# Patient Record
Sex: Female | Born: 1975 | Race: White | Hispanic: No | Marital: Married | Smoking: Never smoker
Health system: Southern US, Community
[De-identification: ages and names within clinical notes are randomized; demographics above are authoritative.]

## PROBLEM LIST (undated history)

## (undated) DIAGNOSIS — F419 Anxiety disorder, unspecified: Secondary | ICD-10-CM

## (undated) HISTORY — DX: Anxiety disorder, unspecified: F41.9

---

## 1999-01-27 ENCOUNTER — Other Ambulatory Visit: Admission: RE | Admit: 1999-01-27 | Discharge: 1999-01-27 | Payer: Self-pay | Admitting: Obstetrics and Gynecology

## 1999-06-23 HISTORY — PX: AUGMENTATION MAMMAPLASTY: SUR837

## 2000-01-26 ENCOUNTER — Other Ambulatory Visit: Admission: RE | Admit: 2000-01-26 | Discharge: 2000-01-26 | Payer: Self-pay | Admitting: Obstetrics and Gynecology

## 2000-08-27 ENCOUNTER — Encounter: Payer: Self-pay | Admitting: Surgery

## 2000-08-27 ENCOUNTER — Encounter: Admission: RE | Admit: 2000-08-27 | Discharge: 2000-08-27 | Payer: Self-pay | Admitting: Surgery

## 2001-02-01 ENCOUNTER — Other Ambulatory Visit: Admission: RE | Admit: 2001-02-01 | Discharge: 2001-02-01 | Payer: Self-pay | Admitting: Obstetrics and Gynecology

## 2002-02-02 ENCOUNTER — Other Ambulatory Visit: Admission: RE | Admit: 2002-02-02 | Discharge: 2002-02-02 | Payer: Self-pay | Admitting: Obstetrics and Gynecology

## 2004-03-19 ENCOUNTER — Other Ambulatory Visit: Admission: RE | Admit: 2004-03-19 | Discharge: 2004-03-19 | Payer: Self-pay | Admitting: Obstetrics and Gynecology

## 2005-03-23 ENCOUNTER — Other Ambulatory Visit: Admission: RE | Admit: 2005-03-23 | Discharge: 2005-03-23 | Payer: Self-pay | Admitting: Obstetrics and Gynecology

## 2006-03-30 ENCOUNTER — Other Ambulatory Visit: Admission: RE | Admit: 2006-03-30 | Discharge: 2006-03-30 | Payer: Self-pay | Admitting: Obstetrics and Gynecology

## 2007-04-05 ENCOUNTER — Other Ambulatory Visit: Admission: RE | Admit: 2007-04-05 | Discharge: 2007-04-05 | Payer: Self-pay | Admitting: Obstetrics and Gynecology

## 2007-06-20 ENCOUNTER — Other Ambulatory Visit: Admission: RE | Admit: 2007-06-20 | Discharge: 2007-06-20 | Payer: Self-pay | Admitting: Obstetrics and Gynecology

## 2007-12-02 ENCOUNTER — Ambulatory Visit (HOSPITAL_COMMUNITY): Admission: RE | Admit: 2007-12-02 | Discharge: 2007-12-02 | Payer: Self-pay | Admitting: Obstetrics and Gynecology

## 2008-04-17 ENCOUNTER — Inpatient Hospital Stay (HOSPITAL_COMMUNITY): Admission: AD | Admit: 2008-04-17 | Discharge: 2008-04-19 | Payer: Self-pay | Admitting: Obstetrics and Gynecology

## 2008-10-23 ENCOUNTER — Other Ambulatory Visit: Admission: RE | Admit: 2008-10-23 | Discharge: 2008-10-23 | Payer: Self-pay | Admitting: Obstetrics and Gynecology

## 2010-03-11 ENCOUNTER — Inpatient Hospital Stay (HOSPITAL_COMMUNITY): Admission: AD | Admit: 2010-03-11 | Discharge: 2010-03-13 | Payer: Self-pay | Admitting: Obstetrics and Gynecology

## 2010-07-13 ENCOUNTER — Encounter: Payer: Self-pay | Admitting: Obstetrics and Gynecology

## 2010-09-04 LAB — CBC
HCT: 36.8 % (ref 36.0–46.0)
HCT: 36.8 % (ref 36.0–46.0)
Hemoglobin: 12.8 g/dL (ref 12.0–15.0)
MCH: 32.5 pg (ref 26.0–34.0)
MCHC: 33.8 g/dL (ref 30.0–36.0)
MCHC: 34.7 g/dL (ref 30.0–36.0)
MCV: 93.4 fL (ref 78.0–100.0)
MCV: 95.2 fL (ref 78.0–100.0)
Platelets: 224 10*3/uL (ref 150–400)
Platelets: 234 10*3/uL (ref 150–400)
RBC: 3.94 MIL/uL (ref 3.87–5.11)
RDW: 13.4 % (ref 11.5–15.5)
RDW: 13.9 % (ref 11.5–15.5)
WBC: 10.5 10*3/uL (ref 4.0–10.5)
WBC: 16.9 10*3/uL — ABNORMAL HIGH (ref 4.0–10.5)

## 2010-11-04 NOTE — Op Note (Signed)
Carolyn Harris, MCCLISH NO.:  [id number]   MEDICAL RECORD NO.:  [id number]          PATIENT TYPE:  INP   LOCATION:  9130                          FACILITY:  [facility]   PHYSICIAN:  Charles A. Delcambre, MDDATE OF BIRTH:  06-17-1976   DATE OF PROCEDURE:  DATE OF DISCHARGE:                               OPERATIVE REPORT   This patient did push for approximately 30 minutes and had a spontaneous  vaginal delivery, intact perineum.  Nuchal cord x1, vigorous female,  Apgars see delivery record 8 and 9 as I recall, 2730 g as I recall,  female.  Delivery was uncomplicated up to that point at approximately 30  minutes.  Placenta had not delivered.  Edge could be felt, but with  pushing and gentle traction the placenta would not move.  She gave  informed consent for manual extraction.  Epidural was adequate.  Hand  was inserted carefully and passed to the fundus.  The top edge of the  placenta was adherent.  This area was removed with dissection under the  placental edge, and the placenta was moved, the majority intact but not  intact because several fragments were removed as well from the top edge.  Careful exploration with the hand yielded smooth intrauterine surface  with no palpable placenta or placenta fragments after extraction.  The  patient tolerated this well.  Estimated blood loss 400 mL.  Intact  perineum.  No repair necessary.  Placenta to pathology.  Mother and baby  are recovering stably at this time.      Charles A. Sydnee Cabal, MD  Electronically Signed     CAD/MEDQ  D:  04/17/2008  T:  04/18/2008  Job:  [id number]

## 2010-12-15 ENCOUNTER — Other Ambulatory Visit (HOSPITAL_COMMUNITY)
Admission: RE | Admit: 2010-12-15 | Discharge: 2010-12-15 | Disposition: A | Discharge status: Home / Self Care | Payer: PRIVATE HEALTH INSURANCE | Source: Ambulatory Visit | Attending: Obstetrics and Gynecology | Admitting: Obstetrics and Gynecology

## 2010-12-15 ENCOUNTER — Other Ambulatory Visit: Payer: Self-pay | Admitting: Obstetrics and Gynecology

## 2010-12-15 DIAGNOSIS — Z01419 Encounter for gynecological examination (general) (routine) without abnormal findings: Secondary | ICD-10-CM | POA: Insufficient documentation

## 2011-01-07 ENCOUNTER — Other Ambulatory Visit: Payer: Self-pay | Admitting: Obstetrics and Gynecology

## 2011-03-23 LAB — CBC
Hemoglobin: 12.9
MCHC: 33.9
MCV: 94.8
Platelets: 221
Platelets: 227
RBC: 3.73 — ABNORMAL LOW
RDW: 13
WBC: 16.9 — ABNORMAL HIGH

## 2011-03-23 LAB — RPR: RPR Ser Ql: NONREACTIVE

## 2013-03-23 ENCOUNTER — Ambulatory Visit: Payer: Self-pay | Admitting: Obstetrics & Gynecology

## 2013-03-23 LAB — CBC
MCH: 30.4 pg (ref 26.0–34.0)
WBC: 5.9 10*3/uL (ref 3.6–11.0)

## 2013-04-06 ENCOUNTER — Ambulatory Visit: Payer: Self-pay | Admitting: Obstetrics & Gynecology

## 2013-04-06 HISTORY — PX: ABDOMINAL HYSTERECTOMY: SHX81

## 2013-04-06 HISTORY — PX: BLADDER SUSPENSION: SHX72

## 2013-04-07 LAB — HEMOGLOBIN: HGB: 12.2 g/dL (ref 12.0–16.0)

## 2013-04-07 LAB — PATHOLOGY REPORT

## 2013-04-27 ENCOUNTER — Ambulatory Visit (INDEPENDENT_AMBULATORY_CARE_PROVIDER_SITE_OTHER): Payer: BC Managed Care – PPO | Admitting: General Surgery

## 2013-04-27 ENCOUNTER — Encounter: Payer: Self-pay | Admitting: General Surgery

## 2013-04-27 VITALS — BP 128/64 | HR 74 | Resp 14 | Ht 66.5 in | Wt 167.0 lb

## 2013-04-27 DIAGNOSIS — M79609 Pain in unspecified limb: Secondary | ICD-10-CM

## 2013-04-27 DIAGNOSIS — M79669 Pain in unspecified lower leg: Secondary | ICD-10-CM

## 2013-04-27 NOTE — Progress Notes (Signed)
Patient ID: Carolyn Harris, female   DOB: 22-Sep-1975, 37 y.o.   MRN: [id number]  Chief Complaint  Patient presents with  . Pain    left calf pain 3-4 days    HPI Carolyn Harris is a 37 y.o. female.  Here today for evaluation of bilateral calf pain.  She states the left calf pain is worse, for 3-4 days. The pain is constant, worse with ambulation. Denies any redness or swelling.  She is post hysterectomy and bladder suspension done on 04-06-13 by Dr Tiburcio Pea. HPI  Past Medical History  Diagnosis Date  . Anxiety     Past Surgical History  Procedure Laterality Date  . Abdominal hysterectomy  04-06-2013  . Bladder suspension  04-06-13    Family History  Problem Relation Age of Onset  . Pulmonary embolism Mother 60    Social History History  Substance Use Topics  . Smoking status: Never Smoker   . Smokeless tobacco: Not on file  . Alcohol Use: No    No Known Allergies  Current Outpatient Prescriptions  Medication Sig Dispense Refill  . escitalopram (LEXAPRO) 10 MG tablet Take 10 mg by mouth daily.       No current facility-administered medications for this visit.    Review of Systems Review of Systems  Blood pressure 128/64, pulse 74, resp. rate 14, height 5' 6.5" (1.689 m), weight 167 lb (75.751 kg).  Physical Exam Physical Exam  Constitutional: She is oriented to person, place, and time. She appears well-developed and well-nourished.  Cardiovascular: Normal rate, regular rhythm and normal heart sounds.   Pulses:      Dorsalis pedis pulses are 2+ on the right side, and 2+ on the left side.       Posterior tibial pulses are 2+ on the right side, and 2+ on the left side.  No lower leg edema, no skin changes, no calf tenderness and no Homans sign  Pulmonary/Chest: Effort normal and breath sounds normal.  Neurological: She is alert and oriented to person, place, and time.  Skin: Skin is warm and dry.    Data Reviewed Duplex scan done today.  Left lower extremity duplex  study of veins was performed. Patient is 3 weeks post hysterectomy complaining of left calf pain starting 3-4 days ago.  The left common femoral and femoral veins are well identified as also the saphenous vein. No clots identified in these veins. Doppler flow appeared to be normal phasic and with good augmentation from  distal compression. Popliteal vein likewise was identified and appears to be normal with normal compression and Doppler flow. The saphenous vein in groin and upper thigh also appears normal.  : No evidence of deep vein thrombosis or superficial phlebitis. Assessment    No evidence of DVT     Plan    Symptomatic care-discussed with pt.        SANKAR,SEEPLAPUTHUR G 04/27/2013, 2:49 PM

## 2013-04-27 NOTE — Patient Instructions (Addendum)
The patient is aware to call back for any questions or concerns or swelling or pain in legs worsen.   Mild compression hose.

## 2013-05-03 ENCOUNTER — Encounter: Payer: Self-pay | Admitting: General Surgery

## 2013-11-29 ENCOUNTER — Ambulatory Visit: Payer: Self-pay | Admitting: Obstetrics & Gynecology

## 2013-11-29 LAB — CBC
HCT: 41.2 % (ref 35.0–47.0)
HGB: 13.9 g/dL (ref 12.0–16.0)
MCH: 29.7 pg (ref 26.0–34.0)
MCHC: 33.8 g/dL (ref 32.0–36.0)
MCV: 88 fL (ref 80–100)
Platelet: 269 10*3/uL (ref 150–440)
RBC: 4.68 10*6/uL (ref 3.80–5.20)
RDW: 12.7 % (ref 11.5–14.5)
WBC: 7.4 10*3/uL (ref 3.6–11.0)

## 2013-12-07 ENCOUNTER — Ambulatory Visit: Payer: Self-pay | Admitting: Obstetrics & Gynecology

## 2013-12-11 LAB — PATHOLOGY REPORT

## 2014-04-23 ENCOUNTER — Encounter: Payer: Self-pay | Admitting: General Surgery

## 2014-10-12 NOTE — Op Note (Signed)
PATIENT NAME:  Carolyn Harris, Carolyn Harris MR#:  [id number] DATE OF BIRTH:  1975/11/20  DATE OF PROCEDURE:  04/06/2013  PREOPERATIVE DIAGNOSIS:  Pelvic organ prolapse with cystocele.   POSTOPERATIVE DIAGNOSIS:  Pelvic organ prolapse with cystocele.   PROCEDURE:  Total vaginal hysterectomy with anterior colporrhaphy and modified McCall culdoplasty.   SURGEON:  Dierdre Searles. Paul Randi Poullard, MD.   ASSISTANT:  Elliot Gurneyarrie Harris. Klett, MD  ANESTHESIA:  General.   ESTIMATED BLOOD LOSS:  Minimal.   COMPLICATIONS:  None.   FINDINGS:  Grade 3+ prolapse of cervix and uterus along with cystocele. The patient had normal ovaries.   DISPOSITION:  To Recovery Room stable.   TECHNIQUE:  The patient is prepped and draped in the usual sterile fashion after adequate anesthesia is obtained in the dorsal lithotomy position. Retractors are placed and the cervix was grasped with a Christella HartiganJacobs tenaculum. The circumference of the cervix is infiltrated with 1% lidocaine with epinephrine and then a circumferential incision is made using Bovie electrocautery. Using Metzenbaum scissors, the posterior cul-de-sac is dissected and penetrated with a long, weighted speculum placed into the posterior cul-de-sac. The uterosacral ligaments are clamped with Heaney clamps, transected, and suture ligated and then sutured to the vaginal cuff. The uterine arteries are clamped, transected, and suture ligated. The anterior peritoneum was dissected with the anterior cul-de-sac penetrated and a retractor was placed. Dissection and clamping is then performed to the level of the cornua with the uterus then amputated. Excellent hemostasis is noted at all clamping and pedicle sites. The ovaries are visualized to be normal along with fallopian tubes attached.   The peritoneum is closed with a running 0 Vicryl suture in a pursestring fashion. The posterior peritoneum is closed with incorporation of the uterosacral ligaments and posterior peritoneum using an Ethibond suture in a  modified McCall culdoplasty technique. The peritoneal closure is achieved with excellent hemostasis and closure noted.   Allis clamps are placed along the anterior midline vaginal wall for anterior colporrhaphy purposes due to significant cystocele. An incision that is then made with a scalpel and further dissection in the midline using Metzenbaum scissors. The endopelvic fascia is then dissected away from the vaginal mucosa. The fascia is then plicated using interrupted 1-0 Vicryl sutures. Excess vaginal mucosa is excised. The vaginal mucosa is then closed with a running locking 2-0 Vicryl suture to incorporate both the anterior colporrhaphy and hysterectomy sites. Excellent hemostasis is noted throughout. The vaginal cavity is irrigated with aspiration of the fluid. A packing sponge with AVC cream applied to the packing sponge is then placed vaginally for overnight use. A Foley catheter is also placed for overnight use with clear urine noted. The patient tolerated the procedure well and goes to the Recovery Room in stable condition. All sponge, instrument, and needle counts are correct.   ____________________________ R. Annamarie MajorPaul Desirae Mancusi, MD rph:jm D: 04/06/2013 10:45:53 ET T: 04/06/2013 10:59:27 ET JOB#: [id number]  cc: Dierdre Searles. Paul Makayleigh Poliquin, MD, <Dictator> Nadara MustardOBERT P Sarely Stracener MD ELECTRONICALLY SIGNED 04/07/2013 7:34

## 2014-10-13 NOTE — Op Note (Signed)
PATIENT NAME:  Carolyn Harris, Carolyn Harris MR#:  [id number] DATE OF BIRTH:  10-Jun-1976  DATE OF PROCEDURE:  12/07/2013  PREOPERATIVE DIAGNOSES: Pelvic pain, dyspareunia.   POSTOPERATIVE DIAGNOSES:  Pelvic pain, dyspareunia.   PROCEDURE: Diagnostic laparoscopy, operative laparoscopy with bilateral salpingectomy, cystoscopy.   SURGEON: Annamarie MajorPaul Harris, Carolyn Harris   ANESTHESIA: General.   ESTIMATED BLOOD LOSS: Minimal.   COMPLICATIONS: None.   FINDINGS: Fallopian tubes with some cysts noted as well as adherent to the pelvic sidewall and area of the vaginal cuff and repair. Ovaries are normal, although there is a ruptured right ovarian cyst visualized, very small, and healing in nature.   DISPOSITION: To recovery room in stable condition.   TECHNIQUE: The patient is prepped in the sterile fashion. After adequate anesthesia is obtained in the dorsal lithotomy position, bladder is drained with a Robinson catheter.   Attention is then turned to the abdomen where a Veress needle inserted with a 5 mm infraumbilical incision after Marcaine is used to anesthetize the skin. Veress needle placement is confirmed using the hanging drop technique and the abdomen is then insufflated with CO2 gas. A 5 mm trocar is then inserted under visualization with the laparoscope with no injuries or bleeding noted. The patient is placed in Trendelenburg  positioning. The above-mentioned findings are visualized.   The 5 mm trocar is then placed in the right and left lower quadrants lateral to the inferior and epigastric blood vessels with no injuries or bleeding noted.  The Harmonic scalpel is used to dissect and excise the right and left fallopian tubes away from the ovary and mesosalpinx with preservation of the ovaries and the main blood supply. The right fallopian tube is also adherent down to the level of the vaginal cuff area and is completely excised down in this area with no bleeding or injuries noted. Ureters are visualized to be out of  harm's way and there is no bowel involvement or complication. The right ovarian cyst is visualized and is cauterized, but there is really no bleeding and no extensive nature to the cyst at this time.  It has already ruptured.  The fluid in the cul-de-sac is aspirated. The patient is leveled . Gas is expelled. Trocars are removed and the skin is closed with Dermabond.   Cystoscopy is performed with saline distention of the bladder up to 400 mL. There are no glomerulations, ulcerations, or other abnormalities noted within the bladder. Bilateral ureters are visualized to be patent and normal. Fluid is removed and then the cystoscope is also removed. The patient goes to the recovery room in stable condition. All sponge, instrument, and needle counts were correct.    ____________________________ R. Annamarie MajorPaul Harris, Carolyn Harris rph:dd D: 12/07/2013 17:26:08 ET T: 12/08/2013 02:37:59 ET JOB#: [id number]  cc: Dierdre Searles. Carolyn Harris, Carolyn Harris, <Dictator> Carolyn Harris ELECTRONICALLY SIGNED 12/18/2013 8:58

## 2015-11-05 ENCOUNTER — Other Ambulatory Visit: Payer: Self-pay | Admitting: Obstetrics & Gynecology

## 2015-11-05 DIAGNOSIS — Z1231 Encounter for screening mammogram for malignant neoplasm of breast: Secondary | ICD-10-CM

## 2016-02-10 ENCOUNTER — Ambulatory Visit: Payer: Self-pay | Admitting: Nurse Practitioner

## 2016-02-11 ENCOUNTER — Ambulatory Visit
Admission: RE | Admit: 2016-02-11 | Discharge: 2016-02-11 | Disposition: A | Discharge status: Home / Self Care | Payer: 59 | Source: Ambulatory Visit | Attending: Obstetrics & Gynecology | Admitting: Obstetrics & Gynecology

## 2016-02-11 ENCOUNTER — Other Ambulatory Visit: Payer: Self-pay | Admitting: Obstetrics & Gynecology

## 2016-02-11 DIAGNOSIS — Z1231 Encounter for screening mammogram for malignant neoplasm of breast: Secondary | ICD-10-CM

## 2016-02-12 ENCOUNTER — Ambulatory Visit: Payer: Self-pay | Admitting: Family Medicine

## 2016-02-12 ENCOUNTER — Ambulatory Visit: Payer: Self-pay | Admitting: Nurse Practitioner

## 2016-03-16 ENCOUNTER — Ambulatory Visit: Payer: Self-pay | Admitting: Family Medicine

## 2016-03-30 ENCOUNTER — Ambulatory Visit: Payer: Self-pay | Admitting: Family Medicine

## 2016-04-06 ENCOUNTER — Ambulatory Visit (INDEPENDENT_AMBULATORY_CARE_PROVIDER_SITE_OTHER): Payer: 59 | Admitting: Family Medicine

## 2016-04-06 ENCOUNTER — Encounter (INDEPENDENT_AMBULATORY_CARE_PROVIDER_SITE_OTHER): Payer: Self-pay

## 2016-04-06 ENCOUNTER — Encounter: Payer: Self-pay | Admitting: Family Medicine

## 2016-04-06 VITALS — BP 120/82 | HR 78 | Temp 99.0°F | Ht 68.0 in | Wt 161.2 lb

## 2016-04-06 DIAGNOSIS — Z0000 Encounter for general adult medical examination without abnormal findings: Secondary | ICD-10-CM

## 2016-04-06 DIAGNOSIS — Z0001 Encounter for general adult medical examination with abnormal findings: Secondary | ICD-10-CM | POA: Insufficient documentation

## 2016-04-06 LAB — COMPREHENSIVE METABOLIC PANEL
ALBUMIN: 4.5 g/dL (ref 3.5–5.2)
ALK PHOS: 59 U/L (ref 39–117)
ALT: 13 U/L (ref 0–35)
AST: 17 U/L (ref 0–37)
BILIRUBIN TOTAL: 0.6 mg/dL (ref 0.2–1.2)
BUN: 9 mg/dL (ref 6–23)
CALCIUM: 9.1 mg/dL (ref 8.4–10.5)
CO2: 27 meq/L (ref 19–32)
Chloride: 104 mEq/L (ref 96–112)
Creatinine, Ser: 0.65 mg/dL (ref 0.40–1.20)
GFR: 106.52 mL/min (ref >60.00)
Glucose, Bld: 114 mg/dL — ABNORMAL HIGH (ref 70–99)
Potassium: 4.1 mEq/L (ref 3.5–5.1)
SODIUM: 139 meq/L (ref 135–145)
TOTAL PROTEIN: 7.2 g/dL (ref 6.0–8.3)

## 2016-04-06 LAB — LIPID PANEL
CHOL/HDL RATIO: 5
Cholesterol: 192 mg/dL (ref 0–200)
HDL: 37.3 mg/dL — AB (ref >39.00)
LDL Cholesterol: 140 mg/dL — ABNORMAL HIGH (ref 0–99)
NonHDL: 155.19
TRIGLYCERIDES: 78 mg/dL (ref 0.0–149.0)
VLDL: 15.6 mg/dL (ref 0.0–40.0)

## 2016-04-06 LAB — CBC
HEMATOCRIT: 39.9 % (ref 36.0–46.0)
Hemoglobin: 13.6 g/dL (ref 12.0–15.0)
MCHC: 34.1 g/dL (ref 30.0–36.0)
MCV: 87.9 fl (ref 78.0–100.0)
Platelets: 273 10*3/uL (ref 150.0–400.0)
RBC: 4.54 Mil/uL (ref 3.87–5.11)
RDW: 12.4 % (ref 11.5–15.5)
WBC: 5.2 10*3/uL (ref 4.0–10.5)

## 2016-04-06 LAB — HEMOGLOBIN A1C: Hgb A1c MFr Bld: 5.3 % (ref 4.6–6.5)

## 2016-04-06 LAB — TSH: TSH: 1.48 u[IU]/mL (ref 0.35–4.50)

## 2016-04-06 MED ORDER — BUPROPION HCL ER (SR) 150 MG PO TB12: 150.0000 mg | ORAL_TABLET | Freq: Two times a day (BID) | ORAL | 1 refills | Status: DC

## 2016-04-06 NOTE — Assessment & Plan Note (Signed)
Flu shot up-to-date. Mammogram up-to-date. No longer needs Pap smear due to being status post hysterectomy. Tetanus up-to-date. Screening labs today.

## 2016-04-06 NOTE — Progress Notes (Signed)
Pre visit review using our clinic review tool, if applicable. No additional management support is needed unless otherwise documented below in the visit note. 

## 2016-04-06 NOTE — Patient Instructions (Signed)
I have increased the Wellbutrin.  Follow up annually.  Take care   Dr. Lacinda Axon   Health Maintenance, Female Adopting a healthy lifestyle and getting preventive care can go a long way to promote health and wellness. Talk with your health care provider about what schedule of regular examinations is right for you. This is a good chance for you to check in with your provider about disease prevention and staying healthy. In between checkups, there are plenty of things you can do on your own. Experts have done a lot of research about which lifestyle changes and preventive measures are most likely to keep you healthy. Ask your health care provider for more information. WEIGHT AND DIET  Eat a healthy diet  Be sure to include plenty of vegetables, fruits, low-fat dairy products, and lean protein.  Do not eat a lot of foods high in solid fats, added sugars, or salt.  Get regular exercise. This is one of the most important things you can do for your health.  Most adults should exercise for at least 150 minutes each week. The exercise should increase your heart rate and make you sweat (moderate-intensity exercise).  Most adults should also do strengthening exercises at least twice a week. This is in addition to the moderate-intensity exercise.  Maintain a healthy weight  Body mass index (BMI) is a measurement that can be used to identify possible weight problems. It estimates body fat based on height and weight. Your health care provider can help determine your BMI and help you achieve or maintain a healthy weight.  For females 67 years of age and older:   A BMI below 18.5 is considered underweight.  A BMI of 18.5 to 24.9 is normal.  A BMI of 25 to 29.9 is considered overweight.  A BMI of 30 and above is considered obese.  Watch levels of cholesterol and blood lipids  You should start having your blood tested for lipids and cholesterol at 41 years of age, then have this test every 5  years.  You may need to have your cholesterol levels checked more often if:  Your lipid or cholesterol levels are high.  You are older than 41 years of age.  You are at high risk for heart disease.  CANCER SCREENING   Lung Cancer  Lung cancer screening is recommended for adults 36-21 years old who are at high risk for lung cancer because of a history of smoking.  A yearly low-dose CT scan of the lungs is recommended for people who:  Currently smoke.  Have quit within the past 15 years.  Have at least a 30-pack-year history of smoking. A pack year is smoking an average of one pack of cigarettes a day for 1 year.  Yearly screening should continue until it has been 15 years since you quit.  Yearly screening should stop if you develop a health problem that would prevent you from having lung cancer treatment.  Breast Cancer  Practice breast self-awareness. This means understanding how your breasts normally appear and feel.  It also means doing regular breast self-exams. Let your health care provider know about any changes, no matter how small.  If you are in your 20s or 30s, you should have a clinical breast exam (CBE) by a health care provider every 1-3 years as part of a regular health exam.  If you are 41 or older, have a CBE every year. Also consider having a breast X-ray (mammogram) every year.  If you have  a family history of breast cancer, talk to your health care provider about genetic screening.  If you are at high risk for breast cancer, talk to your health care provider about having an MRI and a mammogram every year.  Breast cancer gene (BRCA) assessment is recommended for women who have family members with BRCA-related cancers. BRCA-related cancers include:  Breast.  Ovarian.  Tubal.  Peritoneal cancers.  Results of the assessment will determine the need for genetic counseling and BRCA1 and BRCA2 testing. Cervical Cancer Your health care provider may  recommend that you be screened regularly for cancer of the pelvic organs (ovaries, uterus, and vagina). This screening involves a pelvic examination, including checking for microscopic changes to the surface of your cervix (Pap test). You may be encouraged to have this screening done every 3 years, beginning at age 21.  For women ages 30-65, health care providers may recommend pelvic exams and Pap testing every 3 years, or they may recommend the Pap and pelvic exam, combined with testing for human papilloma virus (HPV), every 5 years. Some types of HPV increase your risk of cervical cancer. Testing for HPV may also be done on women of any age with unclear Pap test results.  Other health care providers may not recommend any screening for nonpregnant women who are considered low risk for pelvic cancer and who do not have symptoms. Ask your health care provider if a screening pelvic exam is right for you.  If you have had past treatment for cervical cancer or a condition that could lead to cancer, you need Pap tests and screening for cancer for at least 20 years after your treatment. If Pap tests have been discontinued, your risk factors (such as having a new sexual partner) need to be reassessed to determine if screening should resume. Some women have medical problems that increase the chance of getting cervical cancer. In these cases, your health care provider may recommend more frequent screening and Pap tests. Colorectal Cancer  This type of cancer can be detected and often prevented.  Routine colorectal cancer screening usually begins at 41 years of age and continues through 41 years of age.  Your health care provider may recommend screening at an earlier age if you have risk factors for colon cancer.  Your health care provider may also recommend using home test kits to check for hidden blood in the stool.  A small camera at the end of a tube can be used to examine your colon directly  (sigmoidoscopy or colonoscopy). This is done to check for the earliest forms of colorectal cancer.  Routine screening usually begins at age 50.  Direct examination of the colon should be repeated every 5-10 years through 41 years of age. However, you may need to be screened more often if early forms of precancerous polyps or small growths are found. Skin Cancer  Check your skin from head to toe regularly.  Tell your health care provider about any new moles or changes in moles, especially if there is a change in a mole's shape or color.  Also tell your health care provider if you have a mole that is larger than the size of a pencil eraser.  Always use sunscreen. Apply sunscreen liberally and repeatedly throughout the day.  Protect yourself by wearing long sleeves, pants, a wide-brimmed hat, and sunglasses whenever you are outside. HEART DISEASE, DIABETES, AND HIGH BLOOD PRESSURE   High blood pressure causes heart disease and increases the risk of stroke. High   blood pressure is more likely to develop in:  People who have blood pressure in the high end of the normal range (130-139/85-89 mm Hg).  People who are overweight or obese.  People who are African American.  If you are 18-39 years of age, have your blood pressure checked every 3-5 years. If you are 40 years of age or older, have your blood pressure checked every year. You should have your blood pressure measured twice--once when you are at a hospital or clinic, and once when you are not at a hospital or clinic. Record the average of the two measurements. To check your blood pressure when you are not at a hospital or clinic, you can use:  An automated blood pressure machine at a pharmacy.  A home blood pressure monitor.  If you are between 55 years and 79 years old, ask your health care provider if you should take aspirin to prevent strokes.  Have regular diabetes screenings. This involves taking a blood sample to check your  fasting blood sugar level.  If you are at a normal weight and have a low risk for diabetes, have this test once every three years after 41 years of age.  If you are overweight and have a high risk for diabetes, consider being tested at a younger age or more often. PREVENTING INFECTION  Hepatitis B  If you have a higher risk for hepatitis B, you should be screened for this virus. You are considered at high risk for hepatitis B if:  You were born in a country where hepatitis B is common. Ask your health care provider which countries are considered high risk.  Your parents were born in a high-risk country, and you have not been immunized against hepatitis B (hepatitis B vaccine).  You have HIV or AIDS.  You use needles to inject street drugs.  You live with someone who has hepatitis B.  You have had sex with someone who has hepatitis B.  You get hemodialysis treatment.  You take certain medicines for conditions, including cancer, organ transplantation, and autoimmune conditions. Hepatitis C  Blood testing is recommended for:  Everyone born from 1945 through 1965.  Anyone with known risk factors for hepatitis C. Sexually transmitted infections (STIs)  You should be screened for sexually transmitted infections (STIs) including gonorrhea and chlamydia if:  You are sexually active and are younger than 41 years of age.  You are older than 41 years of age and your health care provider tells you that you are at risk for this type of infection.  Your sexual activity has changed since you were last screened and you are at an increased risk for chlamydia or gonorrhea. Ask your health care provider if you are at risk.  If you do not have HIV, but are at risk, it may be recommended that you take a prescription medicine daily to prevent HIV infection. This is called pre-exposure prophylaxis (PrEP). You are considered at risk if:  You are sexually active and do not regularly use condoms or  know the HIV status of your partner(s).  You take drugs by injection.  You are sexually active with a partner who has HIV. Talk with your health care provider about whether you are at high risk of being infected with HIV. If you choose to begin PrEP, you should first be tested for HIV. You should then be tested every 3 months for as long as you are taking PrEP.  PREGNANCY   If you are   If you are premenopausal and you may become pregnant, ask your health care provider about preconception counseling.  If you may become pregnant, take 400 to 800 micrograms (mcg) of folic acid every day.  If you want to prevent pregnancy, talk to your health care provider about birth control (contraception). OSTEOPOROSIS AND MENOPAUSE   Osteoporosis is a disease in which the bones lose minerals and strength with aging. This can result in serious bone fractures. Your risk for osteoporosis can be identified using a bone density scan.  If you are 65 years of age or older, or if you are at risk for osteoporosis and fractures, ask your health care provider if you should be screened.  Ask your health care provider whether you should take a calcium or vitamin D supplement to lower your risk for osteoporosis.  Menopause may have certain physical symptoms and risks.  Hormone replacement therapy may reduce some of these symptoms and risks. Talk to your health care provider about whether hormone replacement therapy is right for you.  HOME CARE INSTRUCTIONS   Schedule regular health, dental, and eye exams.  Stay current with your immunizations.   Do not use any tobacco products including cigarettes, chewing tobacco, or electronic cigarettes.  If you are pregnant, do not drink alcohol.  If you are breastfeeding, limit how much and how often you drink alcohol.  Limit alcohol intake to no more than 1 drink per day for nonpregnant women. One drink equals 12 ounces of beer, 5 ounces of wine, or 1 ounces of hard  liquor.  Do not use street drugs.  Do not share needles.  Ask your health care provider for help if you need support or information about quitting drugs.  Tell your health care provider if you often feel depressed.  Tell your health care provider if you have ever been abused or do not feel safe at home.   This information is not intended to replace advice given to you by your health care provider. Make sure you discuss any questions you have with your health care provider.   Document Released: 12/22/2010 Document Revised: 06/29/2014 Document Reviewed: 05/10/2013 [employer] Interactive Patient Education 2016 [employer]  

## 2016-04-06 NOTE — Progress Notes (Signed)
Subjective:  Patient ID: Carolyn Harris, female    DOB: 05-Jul-1975  Age: 40 y.o. MRN: [id number]  CC: Establish care/physical exam.  HPI Carolyn Harris is a 40 y.o. female presents to the clinic today to establish care. She desires an annual physical exam.  Preventative Healthcare  Pap smear: No longer needed due to s/p Hysterectomy.  Mammogram: Up to date (02/11/16).  Colonoscopy: N/A.  Immunizations  Tetanus - Up to date. 5 years ago.  Pneumococcal - Not indicated.  Flu - Up to date. 03/31/16.  Zoster - Not indicated.   Labs: Screening labs today.   Alcohol use: See below.  Smoking/tobacco use: No.  Regular dental exams: Yes.   Wears seat belt: Yes.   PMH, Surgical Hx, Family Hx, Social History reviewed and updated as below.  Past Medical History:  Diagnosis Date  . Anxiety    Past Surgical History:  Procedure Laterality Date  . ABDOMINAL HYSTERECTOMY  04-06-2013  . AUGMENTATION MAMMAPLASTY Bilateral 2001  . BLADDER SUSPENSION  04-06-13   Family History  Problem Relation Age of Onset  . Heart disease Father   . Other Mother     Pulmonary hemorrhage per patient report; Age 66.  . Breast cancer Neg Hx    Social History  Substance Use Topics  . Smoking status: Never Smoker  . Smokeless tobacco: Never Used  . Alcohol use Yes   Review of Systems  Cardiovascular: Positive for chest pain.  Psychiatric/Behavioral:       Sadness, anxiety, stress.  All other systems reviewed and are negative.  Objective:   Today's Vitals: BP 120/82 (BP Location: Right Arm, Patient Position: Sitting, Cuff Size: Normal)   Pulse 78   Temp 99 F (37.2 C) (Oral)   Ht 5' 8"  (1.727 m)   Wt 161 lb 4 oz (73.1 kg)   SpO2 98%   BMI 24.52 kg/m   Physical Exam  Constitutional: She is oriented to person, place, and time. She appears well-developed and well-nourished. No distress.  HENT:  Head: Normocephalic and atraumatic.  Nose: Nose normal.  Mouth/Throat: Oropharynx is  clear and moist. No oropharyngeal exudate.  Normal TM's bilaterally.   Eyes: Conjunctivae are normal. No scleral icterus.  Neck: Neck supple. No thyromegaly present.  Cardiovascular: Normal rate and regular rhythm.   No murmur heard. Pulmonary/Chest: Effort normal and breath sounds normal. She has no wheezes. She has no rales.  Abdominal: Soft. She exhibits no distension. There is no tenderness. There is no rebound and no guarding.  Musculoskeletal: Normal range of motion. She exhibits no edema.  Lymphadenopathy:    She has no cervical adenopathy.  Neurological: She is alert and oriented to person, place, and time.  Skin: Skin is warm and dry. No rash noted.  Psychiatric: She has a normal mood and affect.  Vitals reviewed.  Assessment & Plan:   Problem List Items Addressed This Visit    Encounter for well woman exam without gynecological exam - Primary    Flu shot up-to-date. Mammogram up-to-date. No longer needs Pap smear due to being status post hysterectomy. Tetanus up-to-date. Screening labs today.       Relevant Orders   CBC   Comp Met (CMET)   Lipid Profile   HgB A1c   TSH    Other Visit Diagnoses   None.     Outpatient Encounter Prescriptions as of 04/06/2016  Medication Sig  . ALPRAZolam (XANAX) 0.25 MG tablet Take 0.25 mg by mouth as needed  for anxiety.  Marland Kitchen aspirin EC 81 MG tablet Take 81 mg by mouth daily.  Marland Kitchen buPROPion (WELLBUTRIN SR) 150 MG 12 hr tablet Take 1 tablet (150 mg total) by mouth 2 (two) times daily.  . [DISCONTINUED] buPROPion (WELLBUTRIN SR) 150 MG 12 hr tablet TAKE 1 TABLET (150 MG) BY ORAL ROUTE ONCE DAILY  . [DISCONTINUED] escitalopram (LEXAPRO) 10 MG tablet Take 10 mg by mouth daily.   No facility-administered encounter medications on file as of 04/06/2016.     Follow-up: Return in about 1 year (around 04/06/2017).  [place]

## 2016-12-28 ENCOUNTER — Other Ambulatory Visit: Payer: Self-pay | Admitting: Family Medicine

## 2016-12-28 ENCOUNTER — Telehealth: Payer: Self-pay | Admitting: Family Medicine

## 2016-12-28 DIAGNOSIS — Z1231 Encounter for screening mammogram for malignant neoplasm of breast: Secondary | ICD-10-CM

## 2016-12-28 NOTE — Telephone Encounter (Signed)
Patient has already scheduled appointment [facility].

## 2016-12-28 NOTE — Telephone Encounter (Signed)
Pt called and is requesting an order for a mammogram. Please call when ordered.   Call pt @ [phone]

## 2017-02-18 ENCOUNTER — Ambulatory Visit
Admission: RE | Admit: 2017-02-18 | Discharge: 2017-02-18 | Disposition: A | Discharge status: Home / Self Care | Payer: 59 | Source: Ambulatory Visit | Attending: Family Medicine | Admitting: Family Medicine

## 2017-02-18 DIAGNOSIS — Z1231 Encounter for screening mammogram for malignant neoplasm of breast: Secondary | ICD-10-CM | POA: Diagnosis present

## 2017-02-24 ENCOUNTER — Other Ambulatory Visit: Payer: Self-pay

## 2017-02-24 MED ORDER — BUPROPION HCL ER (SR) 150 MG PO TB12: 150.0000 mg | ORAL_TABLET | Freq: Two times a day (BID) | ORAL | 1 refills | Status: DC

## 2017-02-24 MED ORDER — BUPROPION HCL ER (SR) 150 MG PO TB12: 150.0000 mg | ORAL_TABLET | Freq: Two times a day (BID) | ORAL | 0 refills | Status: DC

## 2017-02-25 ENCOUNTER — Other Ambulatory Visit: Payer: Self-pay | Admitting: *Deleted

## 2017-02-25 ENCOUNTER — Inpatient Hospital Stay
Admission: RE | Admit: 2017-02-25 | Discharge: 2017-02-25 | Disposition: A | Discharge status: Home / Self Care | Payer: Self-pay | Source: Ambulatory Visit | Attending: *Deleted | Admitting: *Deleted

## 2017-02-25 DIAGNOSIS — Z9289 Personal history of other medical treatment: Secondary | ICD-10-CM

## 2017-04-07 ENCOUNTER — Encounter: Payer: 59 | Admitting: Family Medicine

## 2017-04-08 ENCOUNTER — Ambulatory Visit (INDEPENDENT_AMBULATORY_CARE_PROVIDER_SITE_OTHER): Payer: 59 | Admitting: Family Medicine

## 2017-04-08 ENCOUNTER — Encounter: Payer: Self-pay | Admitting: Family Medicine

## 2017-04-08 VITALS — BP 114/78 | HR 71 | Temp 98.4°F | Resp 16 | Wt 164.0 lb

## 2017-04-08 DIAGNOSIS — F329 Major depressive disorder, single episode, unspecified: Secondary | ICD-10-CM

## 2017-04-08 DIAGNOSIS — R7309 Other abnormal glucose: Secondary | ICD-10-CM | POA: Diagnosis not present

## 2017-04-08 DIAGNOSIS — R05 Cough: Secondary | ICD-10-CM | POA: Diagnosis not present

## 2017-04-08 DIAGNOSIS — F419 Anxiety disorder, unspecified: Secondary | ICD-10-CM

## 2017-04-08 DIAGNOSIS — Z130 Encounter for screening for diseases of the blood and blood-forming organs and certain disorders involving the immune mechanism: Secondary | ICD-10-CM

## 2017-04-08 DIAGNOSIS — F32A Depression, unspecified: Secondary | ICD-10-CM | POA: Insufficient documentation

## 2017-04-08 DIAGNOSIS — Z13220 Encounter for screening for lipoid disorders: Secondary | ICD-10-CM | POA: Diagnosis not present

## 2017-04-08 DIAGNOSIS — Z1329 Encounter for screening for other suspected endocrine disorder: Secondary | ICD-10-CM

## 2017-04-08 DIAGNOSIS — Z0001 Encounter for general adult medical examination with abnormal findings: Secondary | ICD-10-CM | POA: Diagnosis not present

## 2017-04-08 DIAGNOSIS — R053 Chronic cough: Secondary | ICD-10-CM | POA: Insufficient documentation

## 2017-04-08 LAB — COMPREHENSIVE METABOLIC PANEL
ALBUMIN: 4.4 g/dL (ref 3.5–5.2)
ALK PHOS: 66 U/L (ref 39–117)
ALT: 10 U/L (ref 0–35)
AST: 15 U/L (ref 0–37)
BUN: 12 mg/dL (ref 6–23)
CO2: 29 mEq/L (ref 19–32)
Calcium: 9.3 mg/dL (ref 8.4–10.5)
Chloride: 102 mEq/L (ref 96–112)
Creatinine, Ser: 0.64 mg/dL (ref 0.40–1.20)
GFR: 107.91 mL/min (ref >60.00)
GLUCOSE: 91 mg/dL (ref 70–99)
POTASSIUM: 4.3 meq/L (ref 3.5–5.1)
Sodium: 137 mEq/L (ref 135–145)
TOTAL PROTEIN: 7.2 g/dL (ref 6.0–8.3)
Total Bilirubin: 0.5 mg/dL (ref 0.2–1.2)

## 2017-04-08 LAB — LIPID PANEL
CHOL/HDL RATIO: 5
Cholesterol: 187 mg/dL (ref 0–200)
HDL: 38.5 mg/dL — AB (ref >39.00)
LDL CALC: 121 mg/dL — AB (ref 0–99)
NonHDL: 148.31
TRIGLYCERIDES: 138 mg/dL (ref 0.0–149.0)
VLDL: 27.6 mg/dL (ref 0.0–40.0)

## 2017-04-08 LAB — CBC
HCT: 41.9 % (ref 36.0–46.0)
HEMOGLOBIN: 13.9 g/dL (ref 12.0–15.0)
MCHC: 33.3 g/dL (ref 30.0–36.0)
MCV: 90.3 fl (ref 78.0–100.0)
PLATELETS: 278 10*3/uL (ref 150.0–400.0)
RBC: 4.63 Mil/uL (ref 3.87–5.11)
RDW: 12.4 % (ref 11.5–15.5)
WBC: 6.5 10*3/uL (ref 4.0–10.5)

## 2017-04-08 LAB — HEMOGLOBIN A1C: Hgb A1c MFr Bld: 5.3 % (ref 4.6–6.5)

## 2017-04-08 LAB — TSH: TSH: 1.11 u[IU]/mL (ref 0.35–4.50)

## 2017-04-08 NOTE — Patient Instructions (Signed)
Nice to meet you. We will get lab work.  Please try omeprazole for your cough.  Please call when you need a refill on wellbutrin.

## 2017-04-08 NOTE — Assessment & Plan Note (Signed)
Stable. No SI or HI. She'll continue Wellbutrin and when she runs out she'll let us know so we can switch to the 24-hour version. Continue Xanax as needed.

## 2017-04-08 NOTE — Progress Notes (Signed)
Tommi Rumps, MD Phone: [phone]  Carolyn Harris is a 41 y.o. female who presents today for CPE.  Exercises by walking some and playing with her children. Diet is pretty good. Tries to eat veggies. Does eat fast food. Does have 2 sodas or sweet tea per day. Prior HIV testing with pregnancy. Flu shot last week. Tetanus vaccination up-to-date. Status post hysterectomy. No need for Pap smear. No tobacco use, alcohol use, or illicit drug use. Mammogram up-to-date. She does report chronic depression and anxiety. Takes Wellbutrin. She is interested in switching to the 24-hour version. She rarely takes the Xanax.  patient notes cough over the last year. It is a dry nonproductive cough. Occurs daily. Occurs at random times. Nothing brings it on. She notes no upper respiratory symptoms or reflux symptoms. She notes no other symptoms associated with it.  Active Ambulatory Problems    Diagnosis Date Noted  . Encounter for general adult medical examination with abnormal findings 04/06/2016  . Chronic cough 04/08/2017  . Anxiety and depression 04/08/2017   Resolved Ambulatory Problems    Diagnosis Date Noted  . No Resolved Ambulatory Problems   Past Medical History:  Diagnosis Date  . Anxiety     Family History  Problem Relation Age of Onset  . Heart disease Father   . Other Mother        Pulmonary hemorrhage per patient report; Age 55.  . Breast cancer Neg Hx     Social History   Social History  . Marital status: Married    Spouse name: N/A  . Number of children: N/A  . Years of education: N/A   Occupational History  . Not on file.   Social History Main Topics  . Smoking status: Never Smoker  . Smokeless tobacco: Never Used  . Alcohol use Yes  . Drug use: No  . Sexual activity: Yes    Partners: Male   Other Topics Concern  . Not on file   Social History Narrative  . No narrative on file    ROS  General:  Negative for nexplained weight loss, fever Skin:  Negative for new or changing mole, sore that won't heal HEENT: Negative for trouble hearing, trouble seeing, ringing in ears, mouth sores, hoarseness, change in voice, dysphagia. CV:  Negative for chest pain, dyspnea, edema, palpitations Resp: Positive for cough, negative for  dyspnea, hemoptysis GI: Negative for nausea, vomiting, diarrhea, constipation, abdominal pain, melena, hematochezia. 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, followed by gynecology), Negative for dysuria, urinary hesitance, hematuria, vaginal or penile discharge, polyuria, sexual difficulty, lumps in testicle or breasts MSK: Negative for muscle cramps or aches, joint pain or swelling Neuro: Negative for headaches, weakness, numbness, dizziness, passing out/fainting PsychPositiveor depression, anxiety, memory problems  Objective  Physical Exam Vitals:   04/08/17 0759  BP: 114/78  Pulse: 71  Resp: 16  Temp: 98.4 F (36.9 C)  SpO2: 97%    BP Readings from Last 3 Encounters:  04/08/17 114/78  04/06/16 120/82  04/27/13 128/64   Wt Readings from Last 3 Encounters:  04/08/17 164 lb (74.4 kg)  04/06/16 161 lb 4 oz (73.1 kg)  04/27/13 167 lb (75.8 kg)    Physical Exam  Constitutional: No distress.  HENT:  Head: Normocephalic and atraumatic.  Mouth/Throat: Oropharynx is clear and moist. No oropharyngeal exudate.  Normal TMs  Eyes: Pupils are equal, round, and reactive to light. Conjunctivae are normal.  Neck: Neck supple.  Cardiovascular: Normal  rate, regular rhythm and normal heart sounds.   Pulmonary/Chest: Effort normal and breath sounds normal.  Abdominal: Soft. Bowel sounds are normal. She exhibits no distension. There is no tenderness. There is no rebound and no guarding.  Genitourinary:  Genitourinary Comments: Bilateral breasts with no masses, tenderness, skin changes, or nipple inversion, no axillary masses bilaterally  Musculoskeletal: She exhibits no edema.    Lymphadenopathy:    She has no cervical adenopathy.  Neurological: She is alert. Gait normal.  Skin: Skin is warm and dry. She is not diaphoretic.     Assessment/Plan:   Encounter for general adult medical examination with abnormal findings Physical exam completed. Pelvic exam deferred to gynecology. She's status post hysterectomy and is not due for a Pap smear. Mammogram up-to-date. Discussed diet and exercise.  Chronic cough Chronic cough over the last year. No other symptoms. Benign exam. Discussed obtaining a chest x-ray that she deferred at this time. Will trial omeprazole 20 mg over-the-counter daily for 2 weeks to see if it is silent reflux. If not improving she'll consider chest x-ray.  Anxiety and depression Stable. No SI or HI. She'll continue Wellbutrin and when she runs out she'll let us know so we can switch to the 24-hour version. Continue Xanax as needed.   Orders Placed This Encounter  Procedures  . Comp Met (CMET)  . CBC  . HgB A1c  . Lipid Profile  . TSH    No orders of the defined types were placed in this encounter.    Tommi Rumps, MD [place]

## 2017-04-08 NOTE — Assessment & Plan Note (Signed)
Physical exam completed. Pelvic exam deferred to gynecology. She's status post hysterectomy and is not due for a Pap smear. Mammogram up-to-date. Discussed diet and exercise.

## 2017-04-08 NOTE — Assessment & Plan Note (Signed)
Chronic cough over the last year. No other symptoms. Benign exam. Discussed obtaining a chest x-ray that she deferred at this time. Will trial omeprazole 20 mg over-the-counter daily for 2 weeks to see if it is silent reflux. If not improving she'll consider chest x-ray.

## 2017-04-21 ENCOUNTER — Encounter: Payer: Self-pay | Admitting: Family Medicine

## 2017-04-21 MED ORDER — ALPRAZOLAM 0.25 MG PO TABS: 0.2500 mg | ORAL_TABLET | Freq: Every day | ORAL | 0 refills | Status: DC | PRN

## 2017-04-21 NOTE — Telephone Encounter (Signed)
Last OV 04/08/17 filed under historical

## 2017-04-21 NOTE — Telephone Encounter (Signed)
Please fax

## 2017-04-22 NOTE — Telephone Encounter (Signed)
faxed

## 2017-04-23 ENCOUNTER — Other Ambulatory Visit: Payer: Self-pay | Admitting: Family Medicine

## 2017-05-05 NOTE — Telephone Encounter (Signed)
Last OV 04/08/17 last filled 04/21/17 30 0rf

## 2017-05-06 ENCOUNTER — Telehealth: Payer: Self-pay

## 2017-05-06 NOTE — Telephone Encounter (Signed)
See other message

## 2017-05-06 NOTE — Telephone Encounter (Signed)
Patient states she already had this filled

## 2017-05-06 NOTE — Telephone Encounter (Signed)
Copied from CRM [id number]. Topic: Quick Communication - Office Called Patient >> May 06, 2017  9:36 AM Louie BunPalacios Medina, Rosey Batheresa D wrote: Reason for CRM: Patient was returning CMA Jessica's call. Please call patient back, ok to leave VM.

## 2017-10-19 ENCOUNTER — Other Ambulatory Visit: Payer: Self-pay | Admitting: Family Medicine

## 2017-10-19 NOTE — Telephone Encounter (Signed)
Last OV 04/08/17 last filled 04/21/17 30 0rf   

## 2018-01-18 ENCOUNTER — Other Ambulatory Visit: Payer: Self-pay | Admitting: Family Medicine

## 2018-01-18 DIAGNOSIS — Z1231 Encounter for screening mammogram for malignant neoplasm of breast: Secondary | ICD-10-CM

## 2018-01-19 NOTE — Telephone Encounter (Signed)
Last OV 04/05/17 last filled by Dr.Cook 02/24/17 180 1rf

## 2018-02-22 ENCOUNTER — Other Ambulatory Visit: Payer: Self-pay | Admitting: Family Medicine

## 2018-02-22 ENCOUNTER — Ambulatory Visit
Admission: RE | Admit: 2018-02-22 | Discharge: 2018-02-22 | Disposition: A | Discharge status: Home / Self Care | Payer: BLUE CROSS/BLUE SHIELD | Source: Ambulatory Visit | Attending: Family Medicine | Admitting: Family Medicine

## 2018-02-22 DIAGNOSIS — Z1231 Encounter for screening mammogram for malignant neoplasm of breast: Secondary | ICD-10-CM

## 2018-03-24 DIAGNOSIS — Z23 Encounter for immunization: Secondary | ICD-10-CM | POA: Diagnosis not present

## 2018-04-11 ENCOUNTER — Ambulatory Visit (INDEPENDENT_AMBULATORY_CARE_PROVIDER_SITE_OTHER): Payer: BLUE CROSS/BLUE SHIELD | Admitting: Family Medicine

## 2018-04-11 ENCOUNTER — Encounter: Payer: Self-pay | Admitting: Family Medicine

## 2018-04-11 VITALS — BP 108/78 | HR 53 | Temp 98.4°F | Resp 18 | Ht 67.0 in | Wt 159.2 lb

## 2018-04-11 DIAGNOSIS — Z1329 Encounter for screening for other suspected endocrine disorder: Secondary | ICD-10-CM

## 2018-04-11 DIAGNOSIS — R002 Palpitations: Secondary | ICD-10-CM | POA: Diagnosis not present

## 2018-04-11 DIAGNOSIS — F419 Anxiety disorder, unspecified: Secondary | ICD-10-CM

## 2018-04-11 DIAGNOSIS — Z0001 Encounter for general adult medical examination with abnormal findings: Secondary | ICD-10-CM

## 2018-04-11 DIAGNOSIS — Z130 Encounter for screening for diseases of the blood and blood-forming organs and certain disorders involving the immune mechanism: Secondary | ICD-10-CM

## 2018-04-11 DIAGNOSIS — F329 Major depressive disorder, single episode, unspecified: Secondary | ICD-10-CM

## 2018-04-11 DIAGNOSIS — Z13220 Encounter for screening for lipoid disorders: Secondary | ICD-10-CM

## 2018-04-11 DIAGNOSIS — F32A Depression, unspecified: Secondary | ICD-10-CM

## 2018-04-11 LAB — COMPREHENSIVE METABOLIC PANEL
ALBUMIN: 4.3 g/dL (ref 3.5–5.2)
ALT: 10 U/L (ref 0–35)
AST: 15 U/L (ref 0–37)
Alkaline Phosphatase: 58 U/L (ref 39–117)
BUN: 10 mg/dL (ref 6–23)
CALCIUM: 9 mg/dL (ref 8.4–10.5)
CHLORIDE: 105 meq/L (ref 96–112)
CO2: 28 meq/L (ref 19–32)
Creatinine, Ser: 0.62 mg/dL (ref 0.40–1.20)
GFR: 111.41 mL/min (ref >60.00)
Glucose, Bld: 100 mg/dL — ABNORMAL HIGH (ref 70–99)
Potassium: 4.1 mEq/L (ref 3.5–5.1)
Sodium: 140 mEq/L (ref 135–145)
Total Bilirubin: 0.3 mg/dL (ref 0.2–1.2)
Total Protein: 6.9 g/dL (ref 6.0–8.3)

## 2018-04-11 LAB — LIPID PANEL
CHOL/HDL RATIO: 5
CHOLESTEROL: 173 mg/dL (ref 0–200)
HDL: 35.9 mg/dL — ABNORMAL LOW (ref >39.00)
LDL Cholesterol: 107 mg/dL — ABNORMAL HIGH (ref 0–99)
NonHDL: 137.24
TRIGLYCERIDES: 152 mg/dL — AB (ref 0.0–149.0)
VLDL: 30.4 mg/dL (ref 0.0–40.0)

## 2018-04-11 LAB — CBC
HEMATOCRIT: 40.4 % (ref 36.0–46.0)
HEMOGLOBIN: 13.5 g/dL (ref 12.0–15.0)
MCHC: 33.5 g/dL (ref 30.0–36.0)
MCV: 90 fl (ref 78.0–100.0)
PLATELETS: 276 10*3/uL (ref 150.0–400.0)
RBC: 4.49 Mil/uL (ref 3.87–5.11)
RDW: 12.7 % (ref 11.5–15.5)
WBC: 5 10*3/uL (ref 4.0–10.5)

## 2018-04-11 LAB — TSH: TSH: 1.2 u[IU]/mL (ref 0.35–4.50)

## 2018-04-11 MED ORDER — SERTRALINE HCL 50 MG PO TABS: 50.0000 mg | ORAL_TABLET | Freq: Every day | ORAL | 3 refills | Status: DC

## 2018-04-11 NOTE — Assessment & Plan Note (Addendum)
Normal exam.  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  EKG completed today.  Lab work as outlined below to evaluate underlying cause. Refer to cardiology.

## 2018-04-11 NOTE — Assessment & Plan Note (Signed)
Physical exam completed.  Encouraged her to decrease sugary drink intake.  Continue exercise.  Pap smear deferred given patient is status post total hysterectomy.  Mammogram up-to-date.  Lab work as outlined below.

## 2018-04-11 NOTE — Patient Instructions (Signed)
Nice to see you. We're going to start on Zoloft for anxiety and depression. If you develop thoughts of harming yourself please seek medical attention immediately. We will check lab work today and contact you with the results.  If your lab work does not reveal a cause for your palpitations we will get you to see a cardiologist.

## 2018-04-11 NOTE — Assessment & Plan Note (Signed)
Chronic issues with this.  Uncontrolled currently.  We will trial Zoloft.  Given return precautions.  Follow-up in 3 months.

## 2018-04-11 NOTE — Progress Notes (Signed)
Tommi Rumps, MD Phone: [phone]  Carolyn Harris is a 42 y.o. female who presents today for cpe.  Exercise: 2-3 days per week walking. Diet is fairly healthy.  Gets 3 meals a day.  Fruits and vegetables.  Muffin for breakfast.  Sandwich for lunch.  1 glass of soda per day which is decreased from previously. Status post total hysterectomy for benign reasons.  Mammogram 02/22/2018 negative. No family history of breast, colon, or ovarian cancer. HIV screening done through her prior pregnancies. Flu shot has already been given through work.  Tetanus vaccination up-to-date. No tobacco use, alcohol use, or illicit drug use. She sees an ophthalmologist and a dentist.  Patient reports intermittent fluttering sensation in her chest over the last 2 months.  She notes she will cough and it will go away at times.  Lasts for a few seconds.  Occurs randomly.  Does note some fatigue.  No chest pain or shortness of breath.  She notes chronic issues with anxiety and depression.  She notes she always feels like she is on the go.  She stopped her Wellbutrin.  Rarely takes Xanax.  No SI.  Active Ambulatory Problems    Diagnosis Date Noted  . Encounter for general adult medical examination with abnormal findings 04/06/2016  . Chronic cough 04/08/2017  . Anxiety and depression 04/08/2017  . Palpitations 04/11/2018   Resolved Ambulatory Problems    Diagnosis Date Noted  . No Resolved Ambulatory Problems   Past Medical History:  Diagnosis Date  . Anxiety     Family History  Problem Relation Age of Onset  . Heart disease Father   . Other Mother        Pulmonary hemorrhage per patient report; Age 51.  . Breast cancer Neg Hx     Social History   Socioeconomic History  . Marital status: Married    Spouse name: Not on file  . Number of children: Not on file  . Years of education: Not on file  . Highest education level: Not on file  Occupational History  . Not on file  Social Needs  .  Financial resource strain: Not on file  . Food insecurity:    Worry: Not on file    Inability: Not on file  . Transportation needs:    Medical: Not on file    Non-medical: Not on file  Tobacco Use  . Smoking status: Never Smoker  . Smokeless tobacco: Never Used  Substance and Sexual Activity  . Alcohol use: Yes  . Drug use: No  . Sexual activity: Yes    Partners: Male  Lifestyle  . Physical activity:    Days per week: Not on file    Minutes per session: Not on file  . Stress: Not on file  Relationships  . Social connections:    Talks on phone: Not on file    Gets together: Not on file    Attends religious service: Not on file    Active member of club or organization: Not on file    Attends meetings of clubs or organizations: Not on file    Relationship status: Not on file  . Intimate partner violence:    Fear of current or ex partner: Not on file    Emotionally abused: Not on file    Physically abused: Not on file    Forced sexual activity: Not on file  Other Topics Concern  . Not on file  Social History Narrative  . Not on  file    ROS  General:  Negative for nexplained weight loss, fever Skin: Negative for new or changing mole, sore that won't heal HEENT: Negative for trouble hearing, trouble seeing, ringing in ears, mouth sores, hoarseness, change in voice, dysphagia. CV: Positive for palpitations, negative for chest pain, dyspnea, edema Resp: Negative for cough, dyspnea, hemoptysis GI: Negative for nausea, vomiting, diarrhea, constipation, abdominal pain, melena, hematochezia. GU: Positive for urinary incontinence, negative for dysuria, urinary hesitance, hematuria, vaginal or penile discharge, polyuria, sexual difficulty, lumps in testicle or breasts MSK: Negative for muscle cramps or aches, joint pain or swelling Neuro: Negative for headaches, weakness, numbness, dizziness, passing out/fainting Psych: Positive for depression, anxiety, negative for memory  problems  Objective  Physical Exam Vitals:   04/11/18 0811  BP: 108/78  Pulse: (!) 53  Resp: 18  Temp: 98.4 F (36.9 C)  SpO2: 98%    BP Readings from Last 3 Encounters:  04/11/18 108/78  04/08/17 114/78  04/06/16 120/82   Wt Readings from Last 3 Encounters:  04/11/18 159 lb 4 oz (72.2 kg)  04/08/17 164 lb (74.4 kg)  04/06/16 161 lb 4 oz (73.1 kg)    Physical Exam  Constitutional: No distress.  HENT:  Head: Normocephalic and atraumatic.  Mouth/Throat: Oropharynx is clear and moist.  Eyes: Pupils are equal, round, and reactive to light. Conjunctivae are normal.  Neck: Neck supple.  Cardiovascular: Normal rate, regular rhythm and normal heart sounds.  Pulmonary/Chest: Effort normal and breath sounds normal.  Abdominal: Soft. Bowel sounds are normal. She exhibits no distension. There is no tenderness. There is no rebound and no guarding.  Genitourinary:  Genitourinary Comments: Chaperone used, bilateral breasts with no skin changes, nipple inversion, tenderness, or masses, no axillary masses bilaterally  Musculoskeletal: She exhibits no edema.  Lymphadenopathy:    She has no cervical adenopathy.  Neurological: She is alert.  Skin: Skin is warm and dry. She is not diaphoretic.  Psychiatric:  Notes chronic anxiety and depression, affect is normal   EKG: Sinus rhythm, rate 63, no ischemic changes, no arrhythmia  Assessment/Plan:   Encounter for general adult medical examination with abnormal findings Physical exam completed.  Encouraged her to decrease sugary drink intake.  Continue exercise.  Pap smear deferred given patient is status post total hysterectomy.  Mammogram up-to-date.  Lab work as outlined below.  Palpitations Normal exam.  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  EKG completed today.  Lab work as outlined below to evaluate underlying cause. Refer to cardiology.  Anxiety and depression Chronic issues with this.  Uncontrolled currently.  We  will trial Zoloft.  Given return precautions.  Follow-up in 3 months.   Orders Placed This Encounter  Procedures  . TSH  . Lipid panel  . CBC  . Comp Met (CMET)  . Ambulatory referral to Cardiology    Referral Priority:   Routine    Referral Type:   Consultation    Referral Reason:   Specialty Services Required    Requested Specialty:   Cardiology    Number of Visits Requested:   1  . EKG 12-Lead    Meds ordered this encounter  Medications  . sertraline (ZOLOFT) 50 MG tablet    Sig: Take 1 tablet (50 mg total) by mouth daily.    Dispense:  30 tablet    Refill:  [place], MD [place]

## 2018-04-13 ENCOUNTER — Encounter: Payer: Self-pay | Admitting: Family Medicine

## 2018-05-04 DIAGNOSIS — D1800 Hemangioma unspecified site: Secondary | ICD-10-CM | POA: Diagnosis not present

## 2018-05-04 DIAGNOSIS — Z1283 Encounter for screening for malignant neoplasm of skin: Secondary | ICD-10-CM | POA: Diagnosis not present

## 2018-05-04 DIAGNOSIS — D2230 Melanocytic nevi of unspecified part of face: Secondary | ICD-10-CM | POA: Diagnosis not present

## 2018-05-04 DIAGNOSIS — D225 Melanocytic nevi of trunk: Secondary | ICD-10-CM | POA: Diagnosis not present

## 2018-06-13 ENCOUNTER — Other Ambulatory Visit: Payer: Self-pay | Admitting: Family Medicine

## 2018-06-13 ENCOUNTER — Other Ambulatory Visit: Payer: Self-pay | Admitting: Internal Medicine

## 2018-08-02 ENCOUNTER — Ambulatory Visit: Payer: BLUE CROSS/BLUE SHIELD | Admitting: Family Medicine

## 2018-08-10 ENCOUNTER — Ambulatory Visit: Payer: BLUE CROSS/BLUE SHIELD | Admitting: Family Medicine

## 2018-11-07 ENCOUNTER — Other Ambulatory Visit: Payer: Self-pay | Admitting: Internal Medicine

## 2018-11-07 ENCOUNTER — Encounter: Payer: Self-pay | Admitting: Family Medicine

## 2018-11-07 IMAGING — MG MM  DIGITAL SCREENING BREAST BILAT IMPLANT W/ TOMO W/ CAD
9 of 17 series · 9 of 33 positions shown · non-contrast
Comparison: Previous exam(s).

CLINICAL DATA: Screening.

EXAM:
2D DIGITAL SCREENING BILATERAL MAMMOGRAM WITH IMPLANTS, CAD AND
ADJUNCT TOMO

[R CC (1 of 3)]
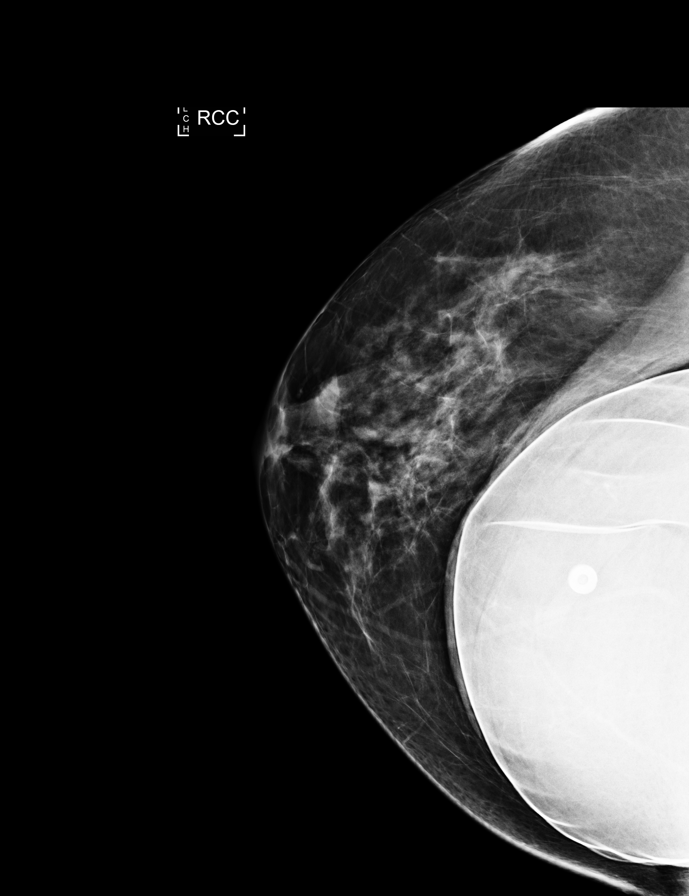

[R MLO (1 of 2)]
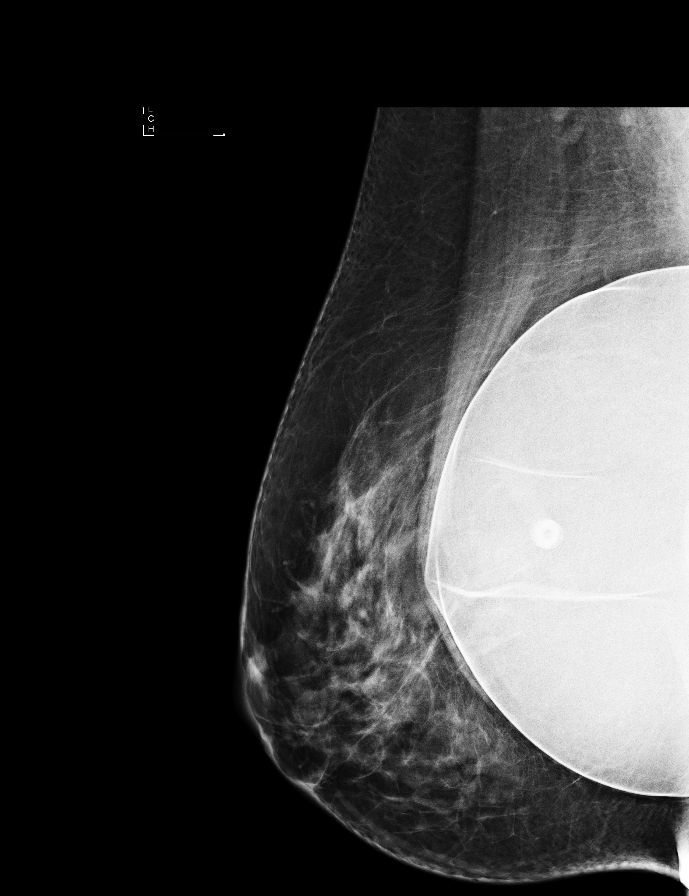

[R CC (2 of 3)]
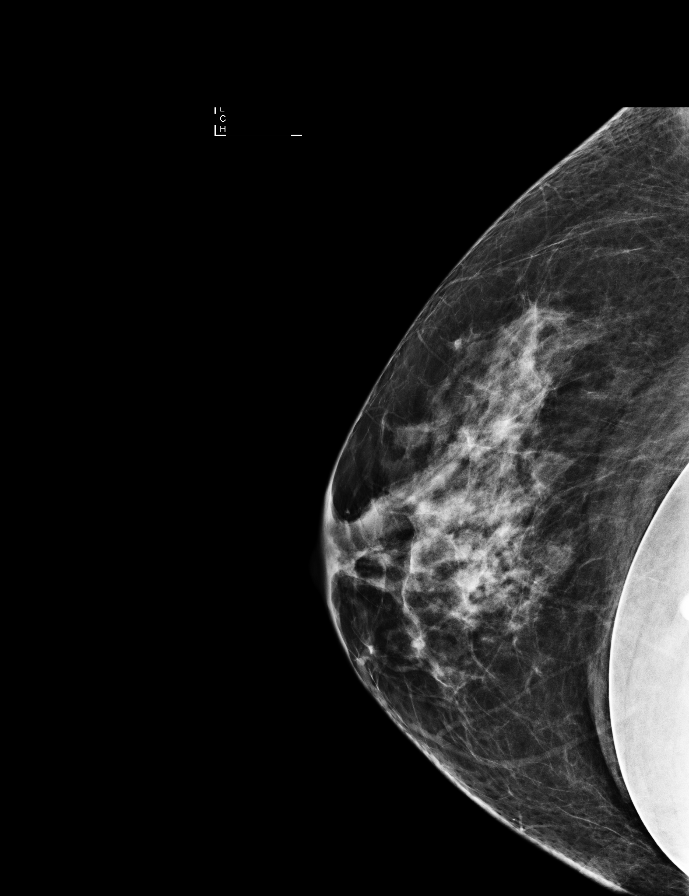

[L CC]
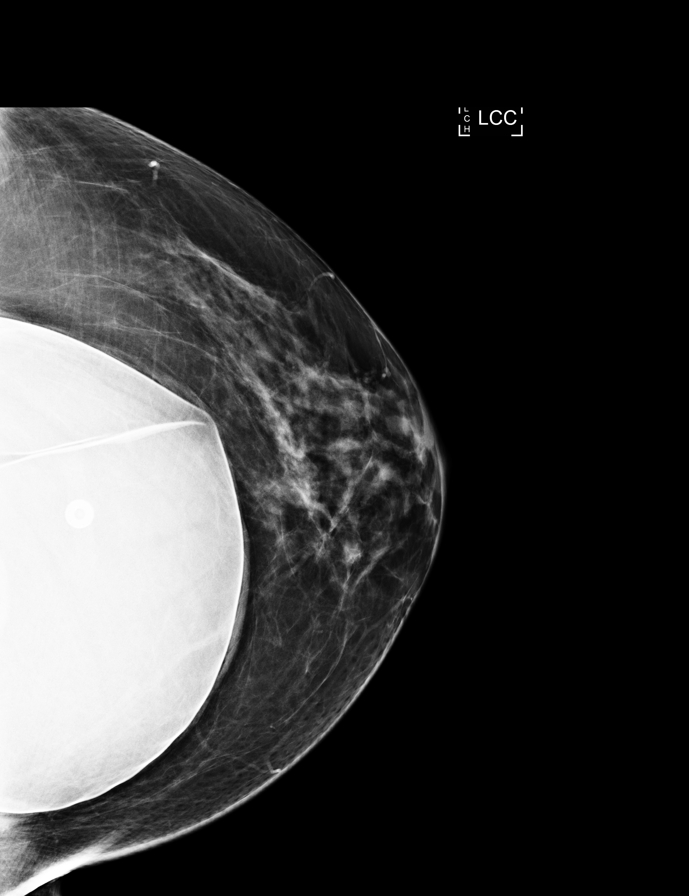

[L MLO]
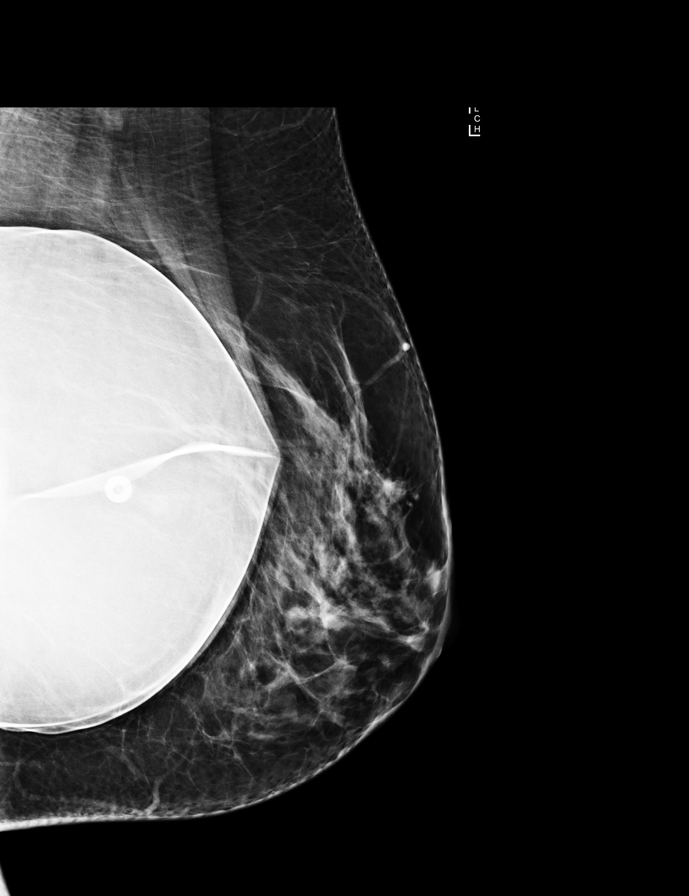

[L CC synth-2D]
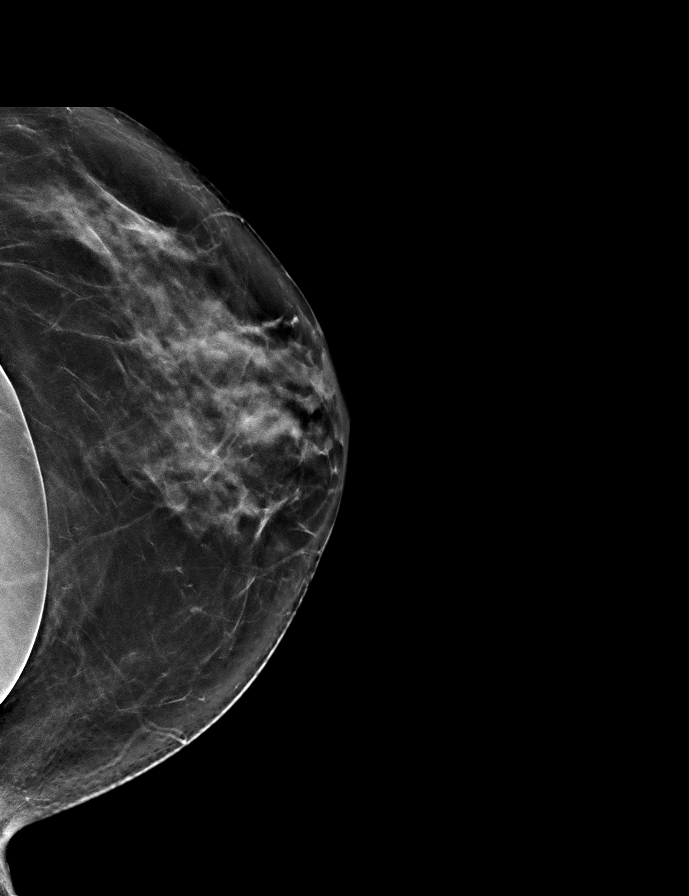

[R MLO synth-2D]
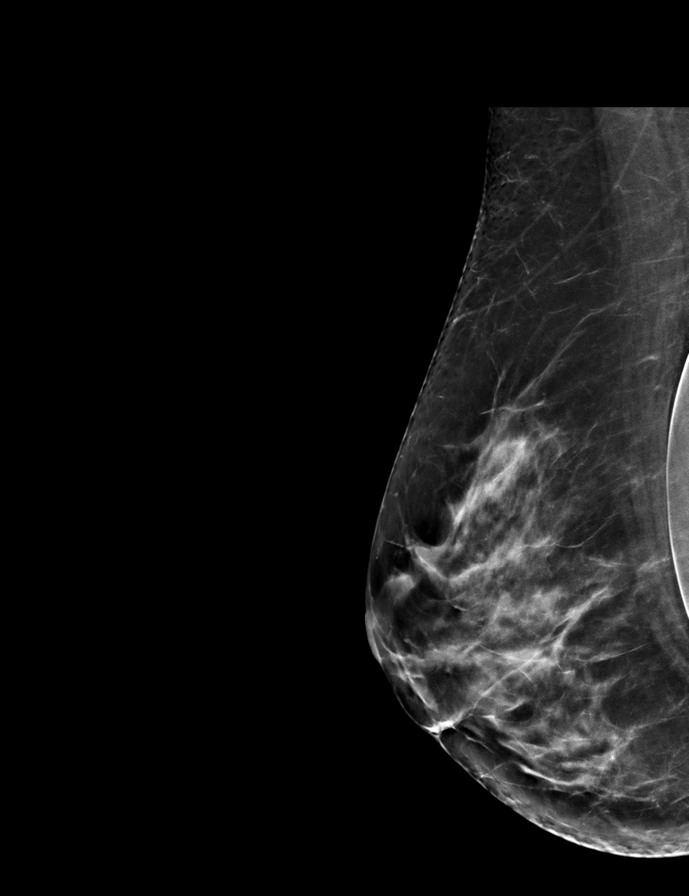

[R CC (3 of 3)]
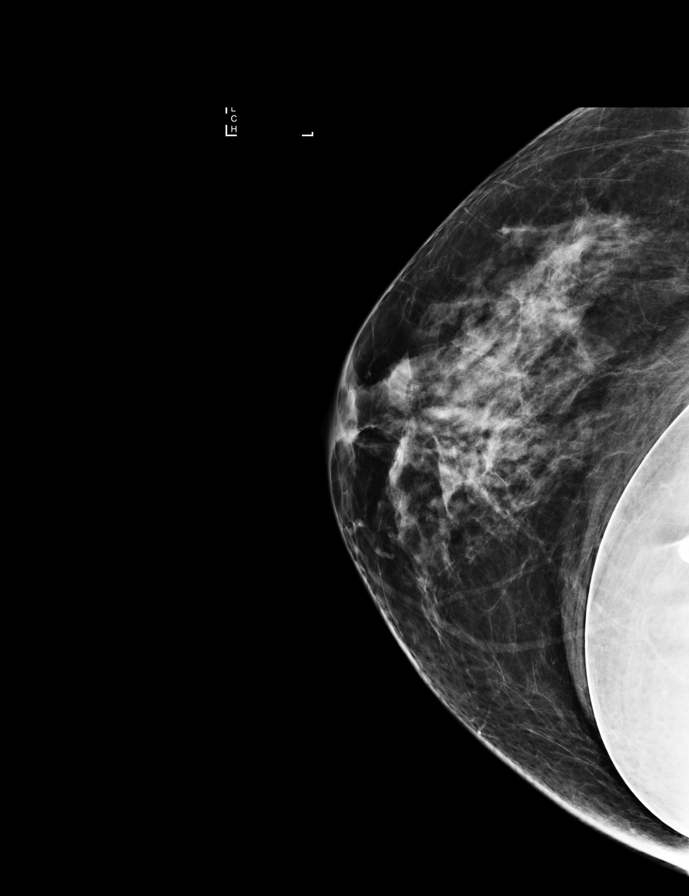

[R MLO (2 of 2)]
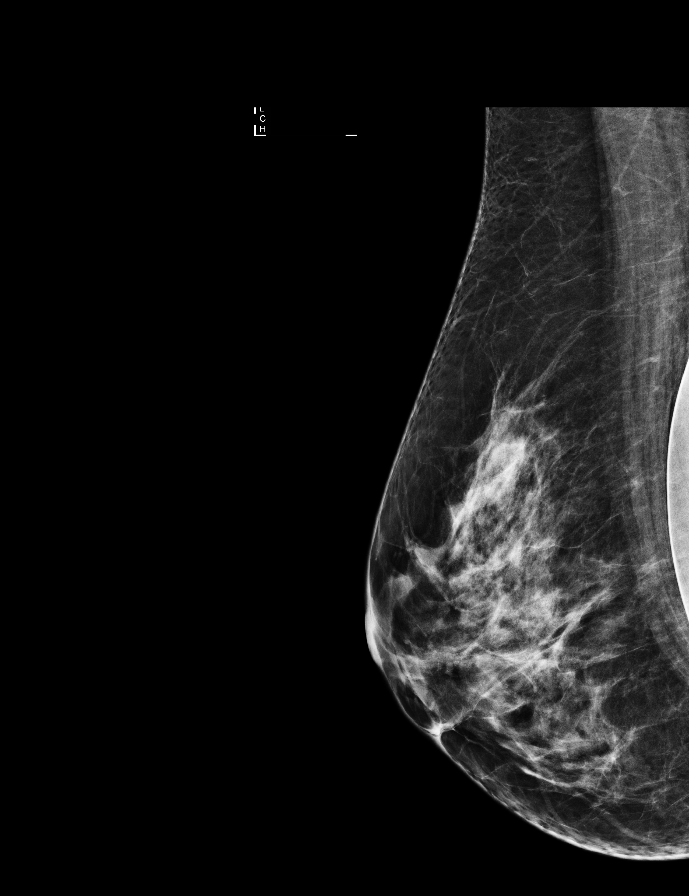

[9 of 33 positions shown; findings below may reference images not displayed]

ACR Breast Density Category c: The breast tissue is heterogeneously
dense, which may obscure small masses.
FINDINGS: The patient has retropectoral saline implants. Standard 2D full
field CC and MLO views of both breasts and implant displaced CC and
MLO views of both breasts with tomosynthesis were obtained.

There are no findings suspicious for malignancy. Images were
processed with CAD.
IMPRESSION: No mammographic evidence of malignancy. A result letter of this
screening mammogram will be mailed directly to the patient.

RECOMMENDATION:
Screening mammogram in one year. (Code:V1-C-ZRA)

BI-RADS CATEGORY  1:  Negative.

## 2018-11-08 ENCOUNTER — Other Ambulatory Visit: Payer: Self-pay

## 2018-11-08 NOTE — Telephone Encounter (Signed)
Last OV 04/11/2018   Last refilled  ALPRAZolam (XANAX) 0.25 MG tablet 15 tablet 0 06/13/2018     Next OV none scheduled   [facility]

## 2018-11-08 NOTE — Telephone Encounter (Signed)
Patient needs a follow-up appointment for anxiety for this to be refilled.  She was previously supposed to follow-up 3 months after her physical in October 2019.

## 2019-01-11 ENCOUNTER — Other Ambulatory Visit: Payer: Self-pay | Admitting: Family Medicine

## 2019-01-11 DIAGNOSIS — Z1231 Encounter for screening mammogram for malignant neoplasm of breast: Secondary | ICD-10-CM

## 2019-02-28 ENCOUNTER — Ambulatory Visit
Admission: RE | Admit: 2019-02-28 | Discharge: 2019-02-28 | Disposition: A | Discharge status: Home / Self Care | Payer: BLUE CROSS/BLUE SHIELD | Source: Ambulatory Visit | Attending: Family Medicine | Admitting: Family Medicine

## 2019-02-28 DIAGNOSIS — Z1231 Encounter for screening mammogram for malignant neoplasm of breast: Secondary | ICD-10-CM | POA: Diagnosis not present

## 2019-03-29 DIAGNOSIS — Z23 Encounter for immunization: Secondary | ICD-10-CM | POA: Diagnosis not present

## 2019-04-12 ENCOUNTER — Other Ambulatory Visit: Payer: Self-pay

## 2019-04-14 ENCOUNTER — Other Ambulatory Visit: Payer: Self-pay

## 2019-04-14 ENCOUNTER — Ambulatory Visit (INDEPENDENT_AMBULATORY_CARE_PROVIDER_SITE_OTHER): Payer: BLUE CROSS/BLUE SHIELD | Admitting: Family Medicine

## 2019-04-14 ENCOUNTER — Encounter: Payer: Self-pay | Admitting: Family Medicine

## 2019-04-14 VITALS — BP 90/60 | HR 64 | Temp 97.7°F | Ht 67.0 in | Wt 163.0 lb

## 2019-04-14 DIAGNOSIS — E663 Overweight: Secondary | ICD-10-CM

## 2019-04-14 DIAGNOSIS — Z0001 Encounter for general adult medical examination with abnormal findings: Secondary | ICD-10-CM

## 2019-04-14 DIAGNOSIS — N8110 Cystocele, unspecified: Secondary | ICD-10-CM | POA: Insufficient documentation

## 2019-04-14 DIAGNOSIS — Z13220 Encounter for screening for lipoid disorders: Secondary | ICD-10-CM

## 2019-04-14 DIAGNOSIS — F419 Anxiety disorder, unspecified: Secondary | ICD-10-CM

## 2019-04-14 DIAGNOSIS — F329 Major depressive disorder, single episode, unspecified: Secondary | ICD-10-CM

## 2019-04-14 DIAGNOSIS — F32A Depression, unspecified: Secondary | ICD-10-CM

## 2019-04-14 MED ORDER — ALPRAZOLAM 0.25 MG PO TABS: ORAL_TABLET | ORAL | 0 refills | Status: DC

## 2019-04-14 MED ORDER — SERTRALINE HCL 50 MG PO TABS: 50.0000 mg | ORAL_TABLET | Freq: Every day | ORAL | 3 refills | Status: DC

## 2019-04-14 NOTE — Progress Notes (Signed)
Tommi Rumps, MD Phone: [phone]  Carolyn Harris is a 43 y.o. female who presents today for CPE.  Exercise: She is walking some. Diet: Eats healthy.  3 meals a day.  Does have a biscuit or cinnamon roll for breakfast.  Gets plenty of fruits and vegetables.  2 sodas per day. Up-to-date on flu vaccine and tetanus vaccine. Patient is status post hysterectomy for uterine prolapse.  Mammogram up-to-date. No family history of breast cancer, ovarian cancer, or colon cancer.   No tobacco use, alcohol use, or illicit drug use. Sees a dentist and an ophthalmologist. Anxiety/depression: Patient notes this may be a little worse with Covid and her kids being out of school at home.  She notes she is working from home.  There have been a lot of stressors.  She has noted lack of sexual desire as well.  No pain with sexual intercourse.  She never started on the sertraline previously.  She has come off of Wellbutrin.  Lexapro caused weight gain.  No SI.  Active Ambulatory Problems    Diagnosis Date Noted  . Encounter for general adult medical examination with abnormal findings 04/06/2016  . Chronic cough 04/08/2017  . Anxiety and depression 04/08/2017  . Palpitations 04/11/2018  . Bladder prolapse, female, acquired 04/14/2019   Resolved Ambulatory Problems    Diagnosis Date Noted  . No Resolved Ambulatory Problems   Past Medical History:  Diagnosis Date  . Anxiety     Family History  Problem Relation Age of Onset  . Heart disease Father   . Other Mother        Pulmonary hemorrhage per patient report; Age 22.  . Breast cancer Neg Hx     Social History   Socioeconomic History  . Marital status: Married    Spouse name: Not on file  . Number of children: Not on file  . Years of education: Not on file  . Highest education level: Not on file  Occupational History  . Not on file  Social Needs  . Financial resource strain: Not on file  . Food insecurity    Worry: Not on file   Inability: Not on file  . Transportation needs    Medical: Not on file    Non-medical: Not on file  Tobacco Use  . Smoking status: Never Smoker  . Smokeless tobacco: Never Used  Substance and Sexual Activity  . Alcohol use: Yes  . Drug use: No  . Sexual activity: Yes    Partners: Male  Lifestyle  . Physical activity    Days per week: Not on file    Minutes per session: Not on file  . Stress: Not on file  Relationships  . Social Herbalist on phone: Not on file    Gets together: Not on file    Attends religious service: Not on file    Active member of club or organization: Not on file    Attends meetings of clubs or organizations: Not on file    Relationship status: Not on file  . Intimate partner violence    Fear of current or ex partner: Not on file    Emotionally abused: Not on file    Physically abused: Not on file    Forced sexual activity: Not on file  Other Topics Concern  . Not on file  Social History Narrative  . Not on file    ROS  General:  Negative for nexplained weight loss, fever Skin: Negative  for new or changing mole, sore that won't heal HEENT: Negative for trouble hearing, trouble seeing, ringing in ears, mouth sores, hoarseness, change in voice, dysphagia. CV:  Negative for chest pain, dyspnea, edema, palpitations Resp: Negative for cough, dyspnea, hemoptysis GI: Negative for nausea, vomiting, diarrhea, constipation, abdominal pain, melena, hematochezia. GU: Positive for incontinence, sexual difficulty, negative for dysuria, urinary hesitance, hematuria, vaginal or penile discharge, polyuria, lumps in testicle or breasts MSK: Negative for muscle cramps or aches, joint pain or swelling Neuro: Negative for headaches, weakness, numbness, dizziness, passing out/fainting Psych: Positive for depression, anxiety, negative for memory problems  Objective  Physical Exam Vitals:   04/14/19 1354  BP: 90/60  Pulse: 64  Temp: 97.7 F (36.5 C)   SpO2: 97%    BP Readings from Last 3 Encounters:  04/14/19 90/60  04/11/18 108/78  04/08/17 114/78   Wt Readings from Last 3 Encounters:  04/14/19 163 lb (73.9 kg)  04/11/18 159 lb 4 oz (72.2 kg)  04/08/17 164 lb (74.4 kg)    Physical Exam Constitutional:      General: She is not in acute distress.    Appearance: She is not diaphoretic.  HENT:     Head: Normocephalic and atraumatic.  Eyes:     Conjunctiva/sclera: Conjunctivae normal.     Pupils: Pupils are equal, round, and reactive to light.  Cardiovascular:     Rate and Rhythm: Normal rate and regular rhythm.     Heart sounds: Normal heart sounds.  Pulmonary:     Effort: Pulmonary effort is normal.     Breath sounds: Normal breath sounds.  Abdominal:     General: Bowel sounds are normal. There is no distension.     Palpations: Abdomen is soft.     Tenderness: There is no abdominal tenderness. There is no guarding or rebound.  Genitourinary:    Comments: Chaperone used, normal labia, normal vaginal mucosa, no vaginal lesions noted, apparent bladder prolapse, bimanual exam with apparent mild bladder prolapse, otherwise no palpable abnormalities, bilateral breast with no skin changes, nipple inversion, masses, or tenderness, no axillary masses bilaterally Lymphadenopathy:     Cervical: No cervical adenopathy.  Skin:    General: Skin is warm and dry.  Neurological:     Mental Status: She is alert.  Psychiatric:     Comments: Mood anxious, depressed, affect normal      Assessment/Plan:   Encounter for general adult medical examination with abnormal findings Physical exam completed.  Encouraged continued activity and healthy diet.  She will contact her gynecologist when she is ready to discuss bladder prolapse treatment.  She will return for lab work.  Bladder prolapse, female, acquired Patient reports feeling as though she has had some recurrence of her prolapse.  This is apparent on exam.  She will let her  gynecologist know when she is ready to consider treatment at [facility].  Anxiety and depression Patient with anxiety and depression.  I suspect this is contributing to her decreased sexual desire.  We will trial her on Zoloft.  Refill of Xanax provided.  Follow-up in 2 months.   Orders Placed This Encounter  Procedures  . Comp Met (CMET)    Standing Status:   Future    Standing Expiration Date:   04/13/2020  . Lipid panel    Standing Status:   Future    Standing Expiration Date:   04/13/2020  . HgB A1c    Standing Status:   Future    Standing Expiration Date:  04/13/2020    Meds ordered this encounter  Medications  . ALPRAZolam (XANAX) 0.25 MG tablet    Sig: TAKE ONE TABLET BY MOUTH EVERY DAY AS NEEDED FOR ANXIETY    Dispense:  15 tablet    Refill:  0  . sertraline (ZOLOFT) 50 MG tablet    Sig: Take 1 tablet (50 mg total) by mouth daily.    Dispense:  30 tablet    Refill:  [place], MD [place]

## 2019-04-14 NOTE — Assessment & Plan Note (Signed)
Physical exam completed.  Encouraged continued activity and healthy diet.  She will contact her gynecologist when she is ready to discuss bladder prolapse treatment.  She will return for lab work.

## 2019-04-14 NOTE — Assessment & Plan Note (Signed)
Patient reports feeling as though she has had some recurrence of her prolapse.  This is apparent on exam.  She will let her gynecologist know when she is ready to consider treatment at [facility].

## 2019-04-14 NOTE — Assessment & Plan Note (Signed)
Patient with anxiety and depression.  I suspect this is contributing to her decreased sexual desire.  We will trial her on Zoloft.  Refill of Xanax provided.  Follow-up in 2 months.

## 2019-04-27 ENCOUNTER — Other Ambulatory Visit: Payer: Self-pay

## 2019-04-27 ENCOUNTER — Other Ambulatory Visit (INDEPENDENT_AMBULATORY_CARE_PROVIDER_SITE_OTHER): Payer: BLUE CROSS/BLUE SHIELD

## 2019-04-27 DIAGNOSIS — E663 Overweight: Secondary | ICD-10-CM | POA: Diagnosis not present

## 2019-04-27 DIAGNOSIS — Z13220 Encounter for screening for lipoid disorders: Secondary | ICD-10-CM | POA: Diagnosis not present

## 2019-04-27 LAB — COMPREHENSIVE METABOLIC PANEL
ALT: 12 U/L (ref 0–35)
AST: 15 U/L (ref 0–37)
Albumin: 4.5 g/dL (ref 3.5–5.2)
Alkaline Phosphatase: 63 U/L (ref 39–117)
BUN: 10 mg/dL (ref 6–23)
CO2: 24 mEq/L (ref 19–32)
Calcium: 9 mg/dL (ref 8.4–10.5)
Chloride: 106 mEq/L (ref 96–112)
Creatinine, Ser: 0.59 mg/dL (ref 0.40–1.20)
GFR: 110.46 mL/min (ref >60.00)
Glucose, Bld: 96 mg/dL (ref 70–99)
Potassium: 3.9 mEq/L (ref 3.5–5.1)
Sodium: 138 mEq/L (ref 135–145)
Total Bilirubin: 0.5 mg/dL (ref 0.2–1.2)
Total Protein: 7.1 g/dL (ref 6.0–8.3)

## 2019-04-27 LAB — LIPID PANEL
Cholesterol: 200 mg/dL (ref 0–200)
HDL: 36.6 mg/dL — ABNORMAL LOW (ref >39.00)
LDL Cholesterol: 143 mg/dL — ABNORMAL HIGH (ref 0–99)
NonHDL: 163.11
Total CHOL/HDL Ratio: 5
Triglycerides: 99 mg/dL (ref 0.0–149.0)
VLDL: 19.8 mg/dL (ref 0.0–40.0)

## 2019-04-27 LAB — HEMOGLOBIN A1C: Hgb A1c MFr Bld: 5.4 % (ref 4.6–6.5)

## 2019-05-02 ENCOUNTER — Other Ambulatory Visit: Payer: Self-pay

## 2019-05-02 DIAGNOSIS — Z20822 Contact with and (suspected) exposure to covid-19: Secondary | ICD-10-CM

## 2019-05-04 LAB — NOVEL CORONAVIRUS, NAA: SARS-CoV-2, NAA: NOT DETECTED

## 2019-06-20 ENCOUNTER — Ambulatory Visit: Payer: BLUE CROSS/BLUE SHIELD | Admitting: Family Medicine

## 2020-01-29 ENCOUNTER — Other Ambulatory Visit: Payer: Self-pay | Admitting: Family Medicine

## 2020-01-29 DIAGNOSIS — Z1231 Encounter for screening mammogram for malignant neoplasm of breast: Secondary | ICD-10-CM

## 2020-02-29 ENCOUNTER — Ambulatory Visit
Admission: RE | Admit: 2020-02-29 | Discharge: 2020-02-29 | Disposition: A | Discharge status: Home / Self Care | Payer: Managed Care, Other (non HMO) | Source: Ambulatory Visit | Attending: Family Medicine | Admitting: Family Medicine

## 2020-02-29 DIAGNOSIS — Z1231 Encounter for screening mammogram for malignant neoplasm of breast: Secondary | ICD-10-CM | POA: Diagnosis not present

## 2020-06-12 ENCOUNTER — Encounter: Payer: BLUE CROSS/BLUE SHIELD | Admitting: Family Medicine

## 2020-07-26 ENCOUNTER — Other Ambulatory Visit: Payer: Self-pay

## 2020-07-30 ENCOUNTER — Encounter: Payer: Self-pay | Admitting: Family Medicine

## 2020-07-30 ENCOUNTER — Ambulatory Visit (INDEPENDENT_AMBULATORY_CARE_PROVIDER_SITE_OTHER): Payer: Managed Care, Other (non HMO) | Admitting: Family Medicine

## 2020-07-30 ENCOUNTER — Other Ambulatory Visit: Payer: Self-pay

## 2020-07-30 VITALS — BP 112/86 | HR 82 | Temp 98.1°F | Ht 67.0 in | Wt 172.2 lb

## 2020-07-30 DIAGNOSIS — F419 Anxiety disorder, unspecified: Secondary | ICD-10-CM | POA: Diagnosis not present

## 2020-07-30 DIAGNOSIS — F32A Depression, unspecified: Secondary | ICD-10-CM

## 2020-07-30 DIAGNOSIS — Z13220 Encounter for screening for lipoid disorders: Secondary | ICD-10-CM | POA: Insufficient documentation

## 2020-07-30 DIAGNOSIS — Z1211 Encounter for screening for malignant neoplasm of colon: Secondary | ICD-10-CM

## 2020-07-30 DIAGNOSIS — Z0001 Encounter for general adult medical examination with abnormal findings: Secondary | ICD-10-CM | POA: Diagnosis not present

## 2020-07-30 DIAGNOSIS — R413 Other amnesia: Secondary | ICD-10-CM | POA: Insufficient documentation

## 2020-07-30 DIAGNOSIS — E663 Overweight: Secondary | ICD-10-CM | POA: Insufficient documentation

## 2020-07-30 LAB — TSH: TSH: 1.79 u[IU]/mL (ref 0.35–4.50)

## 2020-07-30 LAB — COMPREHENSIVE METABOLIC PANEL
ALT: 11 U/L (ref 0–35)
AST: 16 U/L (ref 0–37)
Albumin: 4.5 g/dL (ref 3.5–5.2)
Alkaline Phosphatase: 68 U/L (ref 39–117)
BUN: 8 mg/dL (ref 6–23)
CO2: 28 mEq/L (ref 19–32)
Calcium: 9.3 mg/dL (ref 8.4–10.5)
Chloride: 103 mEq/L (ref 96–112)
Creatinine, Ser: 0.54 mg/dL (ref 0.40–1.20)
GFR: 110.9 mL/min (ref >60.00)
Glucose, Bld: 82 mg/dL (ref 70–99)
Potassium: 3.8 mEq/L (ref 3.5–5.1)
Sodium: 137 mEq/L (ref 135–145)
Total Bilirubin: 0.3 mg/dL (ref 0.2–1.2)
Total Protein: 7 g/dL (ref 6.0–8.3)

## 2020-07-30 LAB — LIPID PANEL
Cholesterol: 208 mg/dL — ABNORMAL HIGH (ref 0–200)
HDL: 36.1 mg/dL — ABNORMAL LOW (ref >39.00)
NonHDL: 171.88
Total CHOL/HDL Ratio: 6
Triglycerides: 234 mg/dL — ABNORMAL HIGH (ref 0.0–149.0)
VLDL: 46.8 mg/dL — ABNORMAL HIGH (ref 0.0–40.0)

## 2020-07-30 LAB — VITAMIN B12: Vitamin B-12: 205 pg/mL — ABNORMAL LOW (ref 211–911)

## 2020-07-30 LAB — LDL CHOLESTEROL, DIRECT: Direct LDL: 131 mg/dL

## 2020-07-30 LAB — HEMOGLOBIN A1C: Hgb A1c MFr Bld: 5.5 % (ref 4.6–6.5)

## 2020-07-30 MED ORDER — ALPRAZOLAM 0.25 MG PO TABS: ORAL_TABLET | ORAL | 0 refills | Status: DC

## 2020-07-30 NOTE — Patient Instructions (Signed)
Nice to see you. Please try to cut down on soda. Please try to increase your exercise as well. We will request GYN records. Please check on your tetanus vaccine from your work. We will get labs today and contact you with the results.

## 2020-07-30 NOTE — Assessment & Plan Note (Signed)
Check labs.  I suspect this is more likely related to stress and anxiety.

## 2020-07-30 NOTE — Progress Notes (Signed)
Tommi Rumps, MD Phone: [phone]  Carolyn Harris is a 45 y.o. female who presents today for CPE.  Diet: generally healthy, plenty of vegetables.  Not much snacking.  Drinks 3-4 sodas per day Exercise: Walking some for exercise, plans to get into the gym Pap smear: History of a hysterectomy for a noncancerous reason, requesting records Colonoscopy: Due Mammogram: Up-to-date Family history-  Colon cancer: No  Breast cancer: No  Ovarian cancer: No Menses: Status post hysterectomy Vaccines-   Flu: Up-to-date through work  Tetanus: Up-to-date through work, she will confirm when this was last completed  COVID19: Up-to-date HIV screening: During prior pregnancy Hep C Screening: During prior pregnancy Tobacco use: No Alcohol use: Rare Illicit Drug use: No Dentist: Yes Ophthalmology: Yes Some chronic anxiety and stress that is stable.  She stopped Zoloft as she did not want to take anything every day.  No SI.  She rarely takes Xanax.  Does note some feeling of scatterbrained for years.  Has to write down notes to remember certain things. She notes chronic urine leakage following her hysterectomy.   Active Ambulatory Problems    Diagnosis Date Noted  . Encounter for general adult medical examination with abnormal findings 04/06/2016  . Chronic cough 04/08/2017  . Anxiety and depression 04/08/2017  . Palpitations 04/11/2018  . Bladder prolapse, female, acquired 04/14/2019  . Lipid screening 07/30/2020  . Overweight 07/30/2020  . Memory difficulty 07/30/2020   Resolved Ambulatory Problems    Diagnosis Date Noted  . No Resolved Ambulatory Problems   Past Medical History:  Diagnosis Date  . Anxiety     Family History  Problem Relation Age of Onset  . Heart disease Father   . Other Mother        Pulmonary hemorrhage per patient report; Age 16.  . Breast cancer Neg Hx     Social History   Socioeconomic History  . Marital status: Married    Spouse name: Not on  file  . Number of children: Not on file  . Years of education: Not on file  . Highest education level: Not on file  Occupational History  . Not on file  Tobacco Use  . Smoking status: Never Smoker  . Smokeless tobacco: Never Used  Substance and Sexual Activity  . Alcohol use: Yes  . Drug use: No  . Sexual activity: Yes    Partners: Male  Other Topics Concern  . Not on file  Social History Narrative  . Not on file   Social Determinants of Health   Financial Resource Strain: Not on file  Food Insecurity: Not on file  Transportation Needs: Not on file  Physical Activity: Not on file  Stress: Not on file  Social Connections: Not on file  Intimate Partner Violence: Not on file    ROS  General:  Negative for nexplained weight loss, fever Skin: Negative for new or changing mole, sore that won't heal HEENT: Negative for trouble hearing, trouble seeing, ringing in ears, mouth sores, hoarseness, change in voice, dysphagia. CV:  Negative for chest pain, dyspnea, edema, palpitations Resp: Negative for cough, dyspnea, hemoptysis GI: Negative for nausea, vomiting, diarrhea, constipation, abdominal pain, melena, hematochezia. GU: Positive for incontinence, negative for dysuria, urinary hesitance, hematuria, vaginal or penile discharge, polyuria, sexual difficulty, lumps in testicle or breasts MSK: Negative for muscle cramps or aches, joint pain or swelling Neuro: Negative for headaches, weakness, numbness, dizziness, passing out/fainting Psych: Positive for anxiety, stress, negative for depression, memory problems  Objective  Physical Exam Vitals:   07/30/20 1205  BP: 112/86  Pulse: 82  Temp: 98.1 F (36.7 C)  SpO2: 98%    BP Readings from Last 3 Encounters:  07/30/20 112/86  04/14/19 90/60  04/11/18 108/78   Wt Readings from Last 3 Encounters:  07/30/20 172 lb 4 oz (78.1 kg)  04/14/19 163 lb (73.9 kg)  04/11/18 159 lb 4 oz (72.2 kg)    Physical  Exam Constitutional:      General: She is not in acute distress.    Appearance: She is not diaphoretic.  HENT:     Head: Normocephalic and atraumatic.  Eyes:     Conjunctiva/sclera: Conjunctivae normal.     Pupils: Pupils are equal, round, and reactive to light.  Cardiovascular:     Rate and Rhythm: Normal rate and regular rhythm.     Heart sounds: Normal heart sounds.  Pulmonary:     Effort: Pulmonary effort is normal.     Breath sounds: Normal breath sounds.  Abdominal:     General: Bowel sounds are normal.     Palpations: Abdomen is soft.  Genitourinary:    Comments: CMA student served as Producer, television/film/video, bilateral breast with no masses, tenderness, nipple inversion, or skin changes, no axillary masses bilaterally Musculoskeletal:        General: No edema.     Right lower leg: No edema.     Left lower leg: No edema.  Lymphadenopathy:     Cervical: No cervical adenopathy.  Skin:    General: Skin is warm and dry.  Neurological:     Mental Status: She is alert.      Assessment/Plan:   Problem List Items Addressed This Visit    Anxiety and depression    Chronic and stable.  The patient does not want daily medication for this.  She very rarely takes Xanax.  I discussed the risk of dependence and addiction as well as the risk of dementia with chronic benzodiazepine use.  Given that she very rarely takes this I think it is reasonable to continue with this.  We will monitor.      Relevant Medications   ALPRAZolam (XANAX) 0.25 MG tablet   Encounter for general adult medical examination with abnormal findings - Primary    Physical exam completed.  Encouraged continued healthy diet.  Discussed decreasing soda intake.  Discussed increasing exercise.  We will request records regarding her prior hysterectomy.  Referral to GI for colonoscopy.  She will check on her tetanus vaccine status through her work.  Lab work as outlined below.      Lipid screening   Relevant Orders   Comp Met  (CMET)   Lipid panel   Memory difficulty    Check labs.  I suspect this is more likely related to stress and anxiety.      Relevant Orders   TSH   B12   Overweight   Relevant Orders   Comp Met (CMET)   HgB A1c    Other Visit Diagnoses    Colon cancer screening       Relevant Orders   Ambulatory referral to Gastroenterology      This visit occurred during the SARS-CoV-2 public health emergency.  Safety protocols were in place, including screening questions prior to the visit, additional usage of staff PPE, and extensive cleaning of exam room while observing appropriate contact time as indicated for disinfecting solutions.    Tommi Rumps, MD [place]

## 2020-07-30 NOTE — Assessment & Plan Note (Signed)
Physical exam completed.  Encouraged continued healthy diet.  Discussed decreasing soda intake.  Discussed increasing exercise.  We will request records regarding her prior hysterectomy.  Referral to GI for colonoscopy.  She will check on her tetanus vaccine status through her work.  Lab work as outlined below.

## 2020-07-30 NOTE — Assessment & Plan Note (Signed)
Chronic and stable.  The patient does not want daily medication for this.  She very rarely takes Xanax.  I discussed the risk of dependence and addiction as well as the risk of dementia with chronic benzodiazepine use.  Given that she very rarely takes this I think it is reasonable to continue with this.  We will monitor.

## 2020-08-02 MED ORDER — CYANOCOBALAMIN 1000 MCG/ML IJ SOLN: INTRAMUSCULAR | 0 refills | Status: DC

## 2020-08-02 NOTE — Addendum Note (Signed)
Addended by: Glori Luis on: 08/02/2020 02:17 PM   Modules accepted: Orders

## 2020-08-05 ENCOUNTER — Encounter: Payer: Managed Care, Other (non HMO) | Admitting: Family Medicine

## 2020-08-14 ENCOUNTER — Other Ambulatory Visit: Payer: Self-pay

## 2020-08-14 ENCOUNTER — Telehealth (INDEPENDENT_AMBULATORY_CARE_PROVIDER_SITE_OTHER): Payer: Self-pay | Admitting: Gastroenterology

## 2020-08-14 DIAGNOSIS — Z1211 Encounter for screening for malignant neoplasm of colon: Secondary | ICD-10-CM

## 2020-08-14 MED ORDER — CLENPIQ 10-3.5-12 MG-GM -GM/160ML PO SOLN: 320.0000 mL | Freq: Once | ORAL | 0 refills | Status: AC

## 2020-08-14 NOTE — Progress Notes (Signed)
cGastroenterology Pre-Procedure Review  Request Date: 09/23/2020 Requesting Physician: Dr. Tobi Bastos   PATIENT REVIEW QUESTIONS: The patient responded to the following health history questions as indicated:    1. Are you having any GI issues? no 2. Do you have a personal history of Polyps? no 3. Do you have a family history of Colon Cancer or Polyps? no 4. Diabetes Mellitus? no 5. Joint replacements in the past 12 months?no 6. Major health problems in the past 3 months?no 7. Any artificial heart valves, MVP, or defibrillator?no    MEDICATIONS & ALLERGIES:    Patient reports the following regarding taking any anticoagulation/antiplatelet therapy:   Plavix, Coumadin, Eliquis, Xarelto, Lovenox, Pradaxa, Brilinta, or Effient? no Aspirin? Yes Aspirin 81mg    Patient confirms/reports the following medications:  Current Outpatient Medications  Medication Sig Dispense Refill  . ALPRAZolam (XANAX) 0.25 MG tablet TAKE ONE TABLET BY MOUTH EVERY DAY AS NEEDED FOR ANXIETY 15 tablet 0  . aspirin EC 81 MG tablet Take 81 mg by mouth daily.    . cyanocobalamin (,VITAMIN B-12,) 1000 MCG/ML injection Inject 1 mL (1000 mcg) into the muscle once weekly for 4 weeks and then once monthly 10 mL 0  . Multiple Vitamins-Minerals (MULTIVITAMIN ADULT PO) Take by mouth.    . sertraline (ZOLOFT) 50 MG tablet Take 1 tablet (50 mg total) by mouth daily. (Patient not taking: Reported on 07/30/2020) 30 tablet 3   No current facility-administered medications for this visit.    Patient confirms/reports the following allergies:  No Known Allergies  No orders of the defined types were placed in this encounter.   AUTHORIZATION INFORMATION Primary Insurance: 1D#: Group #:  Secondary Insurance: 1D#: Group #:  SCHEDULE INFORMATION: Date:  Time: Location:

## 2020-09-20 ENCOUNTER — Other Ambulatory Visit
Admission: RE | Admit: 2020-09-20 | Discharge: 2020-09-20 | Disposition: A | Discharge status: Home / Self Care | Payer: BC Managed Care – PPO | Source: Ambulatory Visit | Attending: Gastroenterology | Admitting: Gastroenterology

## 2020-09-20 ENCOUNTER — Encounter: Payer: Self-pay | Admitting: Gastroenterology

## 2020-09-20 ENCOUNTER — Other Ambulatory Visit: Payer: Self-pay

## 2020-09-20 DIAGNOSIS — Z79899 Other long term (current) drug therapy: Secondary | ICD-10-CM | POA: Diagnosis not present

## 2020-09-20 DIAGNOSIS — Z20822 Contact with and (suspected) exposure to covid-19: Secondary | ICD-10-CM | POA: Insufficient documentation

## 2020-09-20 DIAGNOSIS — Z01812 Encounter for preprocedural laboratory examination: Secondary | ICD-10-CM | POA: Insufficient documentation

## 2020-09-20 DIAGNOSIS — Z7982 Long term (current) use of aspirin: Secondary | ICD-10-CM | POA: Diagnosis not present

## 2020-09-20 DIAGNOSIS — Z1211 Encounter for screening for malignant neoplasm of colon: Secondary | ICD-10-CM | POA: Diagnosis not present

## 2020-09-20 LAB — SARS CORONAVIRUS 2 (TAT 6-24 HRS): SARS Coronavirus 2: NEGATIVE

## 2020-09-23 ENCOUNTER — Encounter: Payer: Self-pay | Admitting: Gastroenterology

## 2020-09-23 ENCOUNTER — Ambulatory Visit
Admission: RE | Admit: 2020-09-23 | Discharge: 2020-09-23 | Disposition: A | Discharge status: Home / Self Care | Payer: BC Managed Care – PPO | Attending: Gastroenterology | Admitting: Gastroenterology

## 2020-09-23 ENCOUNTER — Ambulatory Visit: Payer: BC Managed Care – PPO | Admitting: Certified Registered"

## 2020-09-23 ENCOUNTER — Encounter
Admission: RE | Disposition: A | Discharge status: Home / Self Care | Payer: Self-pay | Source: Home / Self Care | Attending: Gastroenterology

## 2020-09-23 ENCOUNTER — Other Ambulatory Visit: Payer: Self-pay

## 2020-09-23 DIAGNOSIS — Z7982 Long term (current) use of aspirin: Secondary | ICD-10-CM | POA: Insufficient documentation

## 2020-09-23 DIAGNOSIS — Z79899 Other long term (current) drug therapy: Secondary | ICD-10-CM | POA: Diagnosis not present

## 2020-09-23 DIAGNOSIS — Z20822 Contact with and (suspected) exposure to covid-19: Secondary | ICD-10-CM | POA: Insufficient documentation

## 2020-09-23 DIAGNOSIS — Z1211 Encounter for screening for malignant neoplasm of colon: Secondary | ICD-10-CM

## 2020-09-23 HISTORY — PX: COLONOSCOPY WITH PROPOFOL: SHX5780

## 2020-09-23 SURGERY — COLONOSCOPY WITH PROPOFOL
Anesthesia: General

## 2020-09-23 MED ORDER — LIDOCAINE 2% (20 MG/ML) 5 ML SYRINGE
INTRAMUSCULAR | Status: DC | PRN
  Administered 2020-09-23: 25 mg via INTRAVENOUS

## 2020-09-23 MED ORDER — PROPOFOL 10 MG/ML IV BOLUS
INTRAVENOUS | Status: DC | PRN
  Administered 2020-09-23: 50 mg via INTRAVENOUS
  Administered 2020-09-23: 70 mg via INTRAVENOUS

## 2020-09-23 MED ORDER — SODIUM CHLORIDE 0.9 % IV SOLN: INTRAVENOUS | Status: DC

## 2020-09-23 MED ORDER — PROPOFOL 500 MG/50ML IV EMUL
INTRAVENOUS | Status: DC | PRN
  Administered 2020-09-23: 120 ug/kg/min via INTRAVENOUS

## 2020-09-23 MED ORDER — MIDAZOLAM HCL 5 MG/5ML IJ SOLN
INTRAMUSCULAR | Status: DC | PRN
  Administered 2020-09-23: 2 mg via INTRAVENOUS

## 2020-09-23 MED ORDER — MIDAZOLAM HCL 2 MG/2ML IJ SOLN
INTRAMUSCULAR | Status: AC
  Filled 2020-09-23: qty 2

## 2020-09-23 NOTE — Transfer of Care (Signed)
Immediate Anesthesia Transfer of Care Note  Patient: Carolyn Harris  Procedure(s) Performed: COLONOSCOPY WITH PROPOFOL (N/A )  Patient Location: Endoscopy Unit  Anesthesia Type:General  Level of Consciousness: awake and alert   Airway & Oxygen Therapy: Patient Spontanous Breathing  Post-op Assessment: Report given to RN and Post -op Vital signs reviewed and stable  Post vital signs: Reviewed  Last Vitals:  Vitals Value Taken Time  BP 109/64 09/23/20 0830  Temp 36.2 C 09/23/20 0830  Pulse 93 09/23/20 0830  Resp 12 09/23/20 0830  SpO2 99 % 09/23/20 0830  Vitals shown include unvalidated device data.  Last Pain:  Vitals:   09/23/20 0830  TempSrc: Temporal  PainSc: 0-No pain         Complications: No complications documented.

## 2020-09-23 NOTE — Anesthesia Postprocedure Evaluation (Signed)
Anesthesia Post Note  Patient: Carolyn Harris  Procedure(s) Performed: COLONOSCOPY WITH PROPOFOL (N/A )  Patient location during evaluation: Endoscopy Anesthesia Type: General Level of consciousness: awake and alert Pain management: pain level controlled Vital Signs Assessment: post-procedure vital signs reviewed and stable Respiratory status: spontaneous breathing, nonlabored ventilation, respiratory function stable and patient connected to nasal cannula oxygen Cardiovascular status: blood pressure returned to baseline and stable Postop Assessment: no apparent nausea or vomiting Anesthetic complications: no   No complications documented.   Last Vitals:  Vitals:   09/23/20 0840 09/23/20 0850  BP: 106/75 102/75  Pulse: 75 61  Resp: 15 16  Temp:    SpO2: 99% 100%    Last Pain:  Vitals:   09/23/20 0850  TempSrc:   PainSc: 0-No pain                 Corinda Gubler

## 2020-09-23 NOTE — Anesthesia Preprocedure Evaluation (Signed)
Anesthesia Evaluation  Patient identified by MRN, date of birth, ID band Patient awake  General Assessment Comment:Patient says her surgeon told her that after her hysterectomy her blood pressure "bottomed out". Patient normally runs on the lower end of blood pressures though here today they are in the normal range.  Reviewed: Allergy & Precautions, NPO status , Patient's Chart, lab work & pertinent test results  History of Anesthesia Complications Negative for: history of anesthetic complications  Airway Mallampati: II  TM Distance: >3 FB Neck ROM: Full    Dental no notable dental hx. (+) Teeth Intact   Pulmonary neg pulmonary ROS, neg sleep apnea, neg COPD, Patient abstained from smoking.Not current smoker,    Pulmonary exam normal breath sounds clear to auscultation       Cardiovascular Exercise Tolerance: Good METS(-) hypertension(-) CAD and (-) Past MI negative cardio ROS  (-) dysrhythmias  Rhythm:Regular Rate:Normal - Systolic murmurs    Neuro/Psych PSYCHIATRIC DISORDERS Anxiety Depression negative neurological ROS     GI/Hepatic neg GERD  ,(+)     (-) substance abuse  ,   Endo/Other  neg diabetes  Renal/GU negative Renal ROS     Musculoskeletal   Abdominal   Peds  Hematology   Anesthesia Other Findings Past Medical History: No date: Anxiety  Reproductive/Obstetrics                             Anesthesia Physical Anesthesia Plan  ASA: II  Anesthesia Plan: General   Post-op Pain Management:    Induction: Intravenous  PONV Risk Score and Plan: 3 and Ondansetron, Propofol infusion and TIVA  Airway Management Planned: Nasal Cannula  Additional Equipment: None  Intra-op Plan:   Post-operative Plan:   Informed Consent: I have reviewed the patients History and Physical, chart, labs and discussed the procedure including the risks, benefits and alternatives for the proposed  anesthesia with the patient or authorized representative who has indicated his/her understanding and acceptance.     Dental advisory given  Plan Discussed with: CRNA and Surgeon  Anesthesia Plan Comments: (Discussed risks of anesthesia with patient, including possibility of difficulty with spontaneous ventilation under anesthesia necessitating airway intervention, PONV, and rare risks such as cardiac or respiratory or neurological events. Patient understands.)        Anesthesia Quick Evaluation

## 2020-09-23 NOTE — H&P (Signed)
Wyline Mood, MD [address], [place], [place], [address] [address], [place], [place], [address] Phone: [phone]  Fax: [phone]  Primary Care Physician:  Glori Luis, MD   Pre-Procedure History & Physical: HPI:  Carolyn Harris is a 45 y.o. female is here for an colonoscopy.   Past Medical History:  Diagnosis Date  . Anxiety     Past Surgical History:  Procedure Laterality Date  . ABDOMINAL HYSTERECTOMY  04-06-2013  . AUGMENTATION MAMMAPLASTY Bilateral 2001  . BLADDER SUSPENSION  04-06-13    Prior to Admission medications   Medication Sig Start Date End Date Taking? Authorizing Provider  ALPRAZolam Prudy Feeler) 0.25 MG tablet TAKE ONE TABLET BY MOUTH EVERY DAY AS NEEDED FOR ANXIETY 07/30/20  Yes Glori Luis, MD  aspirin EC 81 MG tablet Take 81 mg by mouth daily.   Yes [provider]  cyanocobalamin (,VITAMIN B-12,) 1000 MCG/ML injection Inject 1 mL (1000 mcg) into the muscle once weekly for 4 weeks and then once monthly 08/02/20  Yes Glori Luis, MD  meloxicam (MOBIC) 15 MG tablet Take 15 mg by mouth daily.   Yes [provider]  Multiple Vitamins-Minerals (MULTIVITAMIN ADULT PO) Take by mouth.   Yes [provider]  sertraline (ZOLOFT) 50 MG tablet Take 1 tablet (50 mg total) by mouth daily. 04/14/19   Glori Luis, MD    Allergies as of 08/14/2020  . (No Known Allergies)    Family History  Problem Relation Age of Onset  . Heart disease Father   . Other Mother        Pulmonary hemorrhage per patient report; Age 94.  . Breast cancer Neg Hx     Social History   Socioeconomic History  . Marital status: Married    Spouse name: Not on file  . Number of children: Not on file  . Years of education: Not on file  . Highest education level: Not on file  Occupational History  . Not on file  Tobacco Use  . Smoking status: Never Smoker  . Smokeless tobacco: Never Used  Vaping Use  .  Vaping Use: Never used  Substance and Sexual Activity  . Alcohol use: Yes    Comment: occ  . Drug use: No  . Sexual activity: Yes    Partners: Male  Other Topics Concern  . Not on file  Social History Narrative  . Not on file   Social Determinants of Health   Financial Resource Strain: Not on file  Food Insecurity: Not on file  Transportation Needs: Not on file  Physical Activity: Not on file  Stress: Not on file  Social Connections: Not on file  Intimate Partner Violence: Not on file    Review of Systems: See HPI, otherwise negative ROS  Physical Exam: BP 129/82   Pulse 83   Temp (!) 96.6 F (35.9 C) (Temporal)   Resp 16   Ht 5' 6.5" (1.689 m)   Wt 77.1 kg   SpO2 100%   BMI 27.03 kg/m  General:   Alert,  pleasant and cooperative in NAD Head:  Normocephalic and atraumatic. Neck:  Supple; no masses or thyromegaly. Lungs:  Clear throughout to auscultation, normal respiratory effort.    Heart:  +S1, +S2, Regular rate and rhythm, No edema. Abdomen:  Soft, nontender and nondistended. Normal bowel sounds, without guarding, and without rebound.   Neurologic:  Alert and  oriented x4;  grossly normal  neurologically.  Impression/Plan: Carolyn Harris is here for an colonoscopy to be performed for Screening colonoscopy average risk   Risks, benefits, limitations, and alternatives regarding  colonoscopy have been reviewed with the patient.  Questions have been answered.  All parties agreeable.   Wyline Mood, MD  09/23/2020, 7:58 AM

## 2020-09-23 NOTE — Op Note (Signed)
[facility] Gastroenterology Patient Name: Carolyn Harris Procedure Date: 09/23/2020 8:06 AM MRN: [id number] Account #: [id number] Date of Birth: [birth year] Admit Type: Outpatient Age: 45 Room: [facility] ENDO ROOM 2 Gender: Female Note Status: Finalized Procedure:             Colonoscopy Indications:           Screening for colorectal malignant neoplasm Providers:             Wyline Mood MD, MD Referring MD:          Yehuda Mao. Birdie Sons (Referring MD) Medicines:             Monitored Anesthesia Care Complications:         No immediate complications. Procedure:             Pre-Anesthesia Assessment:                        - Prior to the procedure, a History and Physical was                         performed, and patient medications, allergies and                         sensitivities were reviewed. The patient's tolerance                         of previous anesthesia was reviewed.                        - The risks and benefits of the procedure and the                         sedation options and risks were discussed with the                         patient. All questions were answered and informed                         consent was obtained.                        - ASA Grade Assessment: II - A patient with mild                         systemic disease.                        After obtaining informed consent, the colonoscope was                         passed under direct vision. Throughout the procedure,                         the patient's blood pressure, pulse, and oxygen                         saturations were monitored continuously. The                         Colonoscope was introduced through the anus and  advanced to the the cecum, identified by the                         appendiceal orifice. The colonoscopy was performed                         with ease. The patient tolerated the procedure well.                         The quality of  the bowel preparation was excellent. Findings:      The perianal and digital rectal examinations were normal.      The entire examined colon appeared normal on direct and retroflexion       views. Impression:            - The entire examined colon is normal on direct and                         retroflexion views.                        - No specimens collected. Recommendation:        - Discharge patient to home (with escort).                        - Resume previous diet.                        - Continue present medications.                        - Repeat colonoscopy in 10 years for screening                         purposes. Procedure Code(s):     --- Professional ---                        [phone], Colonoscopy, flexible; diagnostic, including                         collection of specimen(s) by brushing or washing, when                         performed (separate procedure) Diagnosis Code(s):     --- Professional ---                        Z12.11, Encounter for screening for malignant neoplasm                         of colon CPT copyright 2019 American Medical Association. All rights reserved. The codes documented in this report are preliminary and upon coder review may  be revised to meet current compliance requirements. Wyline Mood, MD Wyline Mood MD, MD 09/23/2020 8:26:40 AM This report has been signed electronically. Number of Addenda: 0 Note Initiated On: 09/23/2020 8:06 AM Scope Withdrawal Time: 0 hours 11 minutes 1 second  Total Procedure Duration: 0 hours 13 minutes 27 seconds  Estimated Blood Loss:  Estimated blood loss: none.      [facility]

## 2020-09-24 ENCOUNTER — Encounter: Payer: Self-pay | Admitting: Gastroenterology

## 2020-09-25 ENCOUNTER — Other Ambulatory Visit: Payer: BC Managed Care – PPO

## 2020-12-15 DIAGNOSIS — H60501 Unspecified acute noninfective otitis externa, right ear: Secondary | ICD-10-CM | POA: Diagnosis not present

## 2021-01-03 DIAGNOSIS — Z20822 Contact with and (suspected) exposure to covid-19: Secondary | ICD-10-CM | POA: Diagnosis not present

## 2021-01-15 ENCOUNTER — Other Ambulatory Visit: Payer: Self-pay | Admitting: Family Medicine

## 2021-01-15 DIAGNOSIS — Z1231 Encounter for screening mammogram for malignant neoplasm of breast: Secondary | ICD-10-CM

## 2021-01-30 ENCOUNTER — Other Ambulatory Visit: Payer: Self-pay | Admitting: Family Medicine

## 2021-03-04 ENCOUNTER — Other Ambulatory Visit: Payer: Self-pay

## 2021-03-04 ENCOUNTER — Other Ambulatory Visit: Payer: Self-pay | Admitting: Family Medicine

## 2021-03-04 ENCOUNTER — Ambulatory Visit
Admission: RE | Admit: 2021-03-04 | Discharge: 2021-03-04 | Disposition: A | Discharge status: Home / Self Care | Payer: BC Managed Care – PPO | Source: Ambulatory Visit | Attending: Family Medicine | Admitting: Family Medicine

## 2021-03-04 DIAGNOSIS — Z1231 Encounter for screening mammogram for malignant neoplasm of breast: Secondary | ICD-10-CM

## 2021-04-08 DIAGNOSIS — Z23 Encounter for immunization: Secondary | ICD-10-CM | POA: Diagnosis not present

## 2021-07-21 ENCOUNTER — Other Ambulatory Visit: Payer: Self-pay | Admitting: Family Medicine

## 2021-08-12 ENCOUNTER — Other Ambulatory Visit: Payer: Self-pay

## 2021-08-12 ENCOUNTER — Ambulatory Visit (INDEPENDENT_AMBULATORY_CARE_PROVIDER_SITE_OTHER): Payer: Managed Care, Other (non HMO) | Admitting: Family Medicine

## 2021-08-12 ENCOUNTER — Encounter: Payer: Self-pay | Admitting: Family Medicine

## 2021-08-12 VITALS — BP 110/70 | HR 63 | Temp 98.3°F | Ht 67.0 in | Wt 173.8 lb

## 2021-08-12 DIAGNOSIS — Z23 Encounter for immunization: Secondary | ICD-10-CM

## 2021-08-12 DIAGNOSIS — N644 Mastodynia: Secondary | ICD-10-CM | POA: Diagnosis not present

## 2021-08-12 DIAGNOSIS — F419 Anxiety disorder, unspecified: Secondary | ICD-10-CM

## 2021-08-12 DIAGNOSIS — R5383 Other fatigue: Secondary | ICD-10-CM | POA: Diagnosis not present

## 2021-08-12 DIAGNOSIS — Z13220 Encounter for screening for lipoid disorders: Secondary | ICD-10-CM | POA: Diagnosis not present

## 2021-08-12 DIAGNOSIS — E663 Overweight: Secondary | ICD-10-CM

## 2021-08-12 DIAGNOSIS — Z0001 Encounter for general adult medical examination with abnormal findings: Secondary | ICD-10-CM | POA: Diagnosis not present

## 2021-08-12 DIAGNOSIS — F32A Depression, unspecified: Secondary | ICD-10-CM

## 2021-08-12 LAB — LIPID PANEL
Cholesterol: 219 mg/dL — ABNORMAL HIGH (ref 0–200)
HDL: 38.5 mg/dL — ABNORMAL LOW (ref >39.00)
NonHDL: 180.05
Total CHOL/HDL Ratio: 6
Triglycerides: 223 mg/dL — ABNORMAL HIGH (ref 0.0–149.0)
VLDL: 44.6 mg/dL — ABNORMAL HIGH (ref 0.0–40.0)

## 2021-08-12 LAB — COMPREHENSIVE METABOLIC PANEL
ALT: 13 U/L (ref 0–35)
AST: 17 U/L (ref 0–37)
Albumin: 4.6 g/dL (ref 3.5–5.2)
Alkaline Phosphatase: 60 U/L (ref 39–117)
BUN: 10 mg/dL (ref 6–23)
CO2: 28 mEq/L (ref 19–32)
Calcium: 9.2 mg/dL (ref 8.4–10.5)
Chloride: 104 mEq/L (ref 96–112)
Creatinine, Ser: 0.52 mg/dL (ref 0.40–1.20)
GFR: 111.11 mL/min (ref >60.00)
Glucose, Bld: 96 mg/dL (ref 70–99)
Potassium: 3.7 mEq/L (ref 3.5–5.1)
Sodium: 138 mEq/L (ref 135–145)
Total Bilirubin: 0.4 mg/dL (ref 0.2–1.2)
Total Protein: 6.9 g/dL (ref 6.0–8.3)

## 2021-08-12 LAB — CBC
HCT: 39.8 % (ref 36.0–46.0)
Hemoglobin: 13.4 g/dL (ref 12.0–15.0)
MCHC: 33.6 g/dL (ref 30.0–36.0)
MCV: 88.5 fl (ref 78.0–100.0)
Platelets: 295 10*3/uL (ref 150.0–400.0)
RBC: 4.49 Mil/uL (ref 3.87–5.11)
RDW: 12.3 % (ref 11.5–15.5)
WBC: 6.3 10*3/uL (ref 4.0–10.5)

## 2021-08-12 LAB — HEMOGLOBIN A1C: Hgb A1c MFr Bld: 5.5 % (ref 4.6–6.5)

## 2021-08-12 LAB — TSH: TSH: 1.44 u[IU]/mL (ref 0.35–5.50)

## 2021-08-12 LAB — VITAMIN D 25 HYDROXY (VIT D DEFICIENCY, FRACTURES): VITD: 30.53 ng/mL (ref 30.00–100.00)

## 2021-08-12 LAB — VITAMIN B12: Vitamin B-12: 452 pg/mL (ref 211–911)

## 2021-08-12 LAB — LDL CHOLESTEROL, DIRECT: Direct LDL: 149 mg/dL

## 2021-08-12 MED ORDER — ALPRAZOLAM 0.25 MG PO TABS: ORAL_TABLET | ORAL | 0 refills | Status: DC

## 2021-08-12 NOTE — Assessment & Plan Note (Signed)
Generally stable.  The patient is hesitant to go on a daily medication given prior weight gain with other medications or an adequate response to other medications.  I discussed we could try Cymbalta or Effexor given low risk of weight gain with those.  She will think about this and let me know if she would like to proceed with those in the future.  She can continue the Xanax and she will rarely take this.  Discussed the risk of dementia and dependence on this type of medication with chronic use.  We will follow-up in about 6 months.

## 2021-08-12 NOTE — Assessment & Plan Note (Signed)
Bilateral pain.  Undetermined cause.  Benign exam.  Discussed obtaining a diagnostic mammogram and ultrasounds to evaluate for an underlying cause.  She advised me to place the order and she will check with her insurance regarding coverage.

## 2021-08-12 NOTE — Progress Notes (Signed)
Tommi Rumps, MD Phone: [phone]  Carolyn Harris is a 46 y.o. female who presents today for CPE.  Diet: plenty of fruits and vegetables, some red meat, 2 sodas per day Exercise: walks 2x/week Pap smear: s/p hysterectomy Colonoscopy: 09/23/20 Mammogram: 03/07/21 Family history-  Colon cancer: no  Breast cancer: no  Ovarian cancer: no Menses: hysterectomy Vaccines-   Flu: UTD  Tetanus: due  COVID19: x3 HIV screening: declined in the past Hep C Screening: declined in the past Tobacco use: no Alcohol use: no Illicit Drug use: no Dentist: yes Ophthalmology: yes  Breast pain: The patient reports sometime ago she had several days of intense breast pain bilaterally.  Notes if she bumped against them at all there was significant pain.  Notes this resolved on its own.  She did not have increased caffeine intake at that time.  She noted no nipple discharge or nipple retraction.  Anxiety/depression: Patient reports this is stable.  She is not taking Zoloft.  She takes the Xanax rarely when she needs help relaxing.  She does not take it at work.  It does not make her drowsy.  She does not want to use it excessively given that her mother was a narcotic abuser.  She notes no SI or HI.  She notes in the past she has been on Lexapro and Wellbutrin as well with no benefit and they all caused weight gain.  She does report chronic tiredness and not wanting to do anything when she gets home from work.  She works and cares for her family and goes constantly from early in the morning until 8 PM.   Active Ambulatory Problems    Diagnosis Date Noted   Encounter for general adult medical examination with abnormal findings 04/06/2016   Chronic cough 04/08/2017   Anxiety and depression 04/08/2017   Palpitations 04/11/2018   Bladder prolapse, female, acquired 04/14/2019   Lipid screening 07/30/2020   Overweight 07/30/2020   Memory difficulty 07/30/2020   Breast pain 08/12/2021   Resolved  Ambulatory Problems    Diagnosis Date Noted   No Resolved Ambulatory Problems   Past Medical History:  Diagnosis Date   Anxiety     Family History  Problem Relation Age of Onset   Heart disease Father    Other Mother        Pulmonary hemorrhage per patient report; Age 64.   Breast cancer Neg Hx     Social History   Socioeconomic History   Marital status: Married    Spouse name: Not on file   Number of children: Not on file   Years of education: Not on file   Highest education level: Not on file  Occupational History   Not on file  Tobacco Use   Smoking status: Never   Smokeless tobacco: Never  Vaping Use   Vaping Use: Never used  Substance and Sexual Activity   Alcohol use: Yes    Comment: occ   Drug use: No   Sexual activity: Yes    Partners: Male  Other Topics Concern   Not on file  Social History Narrative   Not on file   Social Determinants of Health   Financial Resource Strain: Not on file  Food Insecurity: Not on file  Transportation Needs: Not on file  Physical Activity: Not on file  Stress: Not on file  Social Connections: Not on file  Intimate Partner Violence: Not on file    ROS  General:  Negative for nexplained weight  loss, fever Skin: Negative for new or changing mole, sore that won't heal HEENT: Positive for trouble seeing (near vision worse) negative for trouble hearing, ringing in ears, mouth sores, hoarseness, change in voice, dysphagia. CV:  Negative for chest pain, dyspnea, edema, palpitations Resp: Negative for cough, dyspnea, hemoptysis GI: Negative for nausea, vomiting, diarrhea, constipation, abdominal pain, melena, hematochezia. GU: Positive for frequent urination related to bladder prolapse, negative for dysuria, incontinence, urinary hesitance, hematuria, vaginal or penile discharge, sexual difficulty, lumps in testicle or breasts MSK: Negative for muscle cramps or aches, joint pain or swelling Neuro: Negative for headaches,  weakness, numbness, dizziness, passing out/fainting Psych: Positive for depression, anxiety, negative for memory problems  Objective  Physical Exam Vitals:   08/12/21 1205  BP: 110/70  Pulse: 63  Temp: 98.3 F (36.8 C)  SpO2: 99%    BP Readings from Last 3 Encounters:  08/12/21 110/70  09/23/20 102/75  07/30/20 112/86   Wt Readings from Last 3 Encounters:  08/12/21 173 lb 12.8 oz (78.8 kg)  09/23/20 170 lb (77.1 kg)  07/30/20 172 lb 4 oz (78.1 kg)    Physical Exam Constitutional:      General: She is not in acute distress.    Appearance: She is not diaphoretic.  HENT:     Head: Normocephalic and atraumatic.  Cardiovascular:     Rate and Rhythm: Normal rate and regular rhythm.     Heart sounds: Normal heart sounds.  Pulmonary:     Effort: Pulmonary effort is normal.     Breath sounds: Normal breath sounds.  Chest:     Comments: [place] Mole, CMA served as chaperone, bilateral breast with no skin changes, nipple inversion, masses, or tenderness, no axillary masses bilaterally Abdominal:     General: There is no distension.     Tenderness: There is no abdominal tenderness. There is no guarding or rebound.  Musculoskeletal:     Right lower leg: No edema.  Lymphadenopathy:     Cervical: No cervical adenopathy.  Skin:    General: Skin is warm and dry.  Neurological:     Mental Status: She is alert.     Assessment/Plan:   Problem List Items Addressed This Visit     Anxiety and depression    Generally stable.  The patient is hesitant to go on a daily medication given prior weight gain with other medications or an adequate response to other medications.  I discussed we could try Cymbalta or Effexor given low risk of weight gain with those.  She will think about this and let me know if she would like to proceed with those in the future.  She can continue the Xanax and she will rarely take this.  Discussed the risk of dementia and dependence on this type of medication  with chronic use.  We will follow-up in about 6 months.      Relevant Medications   ALPRAZolam (XANAX) 0.25 MG tablet   Breast pain    Bilateral pain.  Undetermined cause.  Benign exam.  Discussed obtaining a diagnostic mammogram and ultrasounds to evaluate for an underlying cause.  She advised me to place the order and she will check with her insurance regarding coverage.      Relevant Orders   MM DIAG BREAST TOMO BILATERAL   US BREAST LTD UNI LEFT INC AXILLA   US BREAST LTD UNI RIGHT [place] AXILLA   Encounter for general adult medical examination with abnormal findings - Primary  Physical exam completed.  I encouraged increasing exercise and healthy diet.  Discussed reducing soda intake.  Tetanus vaccine given today.  Lab work as outlined.      Lipid screening   Relevant Orders   Comp Met (CMET)   Lipid panel   Overweight   Relevant Orders   HgB A1c   Other Visit Diagnoses     Other fatigue       Relevant Orders   CBC   B12   TSH   Vitamin D (25 hydroxy)   Need for tetanus booster       Relevant Orders   Td : Tetanus/diphtheria >7yo Preservative  free (Completed)       Return in about 6 months (around 02/09/2022) for Anxiety.  This visit occurred during the SARS-CoV-2 public health emergency.  Safety protocols were in place, including screening questions prior to the visit, additional usage of staff PPE, and extensive cleaning of exam room while observing appropriate contact time as indicated for disinfecting solutions.    Tommi Rumps, MD [place]

## 2021-08-12 NOTE — Patient Instructions (Signed)
Nice to see you. We will get lab work today. Please try to increase your walking and reduce your soda intake. Please contact your insurance to ensure that the diagnostic mammogram and bilateral breast ultrasounds are covered for a diagnosis of breast pain.  These will be scheduled for you and somebody should reach out to get those scheduled. Please consider Cymbalta or Effexor for your anxiety.  If you would like to try either of those please let us know.

## 2021-08-12 NOTE — Assessment & Plan Note (Signed)
Physical exam completed.  I encouraged increasing exercise and healthy diet.  Discussed reducing soda intake.  Tetanus vaccine given today.  Lab work as outlined.

## 2021-09-08 ENCOUNTER — Ambulatory Visit: Payer: Managed Care, Other (non HMO)

## 2022-02-02 ENCOUNTER — Other Ambulatory Visit: Payer: Self-pay | Admitting: Family Medicine

## 2022-02-02 DIAGNOSIS — Z1231 Encounter for screening mammogram for malignant neoplasm of breast: Secondary | ICD-10-CM

## 2022-03-05 ENCOUNTER — Ambulatory Visit
Admission: RE | Admit: 2022-03-05 | Discharge: 2022-03-05 | Disposition: A | Discharge status: Home / Self Care | Payer: Managed Care, Other (non HMO) | Source: Ambulatory Visit | Attending: Family Medicine | Admitting: Family Medicine

## 2022-03-05 DIAGNOSIS — Z1231 Encounter for screening mammogram for malignant neoplasm of breast: Secondary | ICD-10-CM | POA: Insufficient documentation

## 2022-06-10 ENCOUNTER — Encounter: Payer: Self-pay | Admitting: Family Medicine

## 2022-08-18 ENCOUNTER — Encounter: Payer: Managed Care, Other (non HMO) | Admitting: Family Medicine

## 2022-08-19 ENCOUNTER — Encounter: Payer: Managed Care, Other (non HMO) | Admitting: Family Medicine

## 2022-09-09 ENCOUNTER — Encounter: Payer: Self-pay | Admitting: Family Medicine

## 2022-09-09 ENCOUNTER — Ambulatory Visit (INDEPENDENT_AMBULATORY_CARE_PROVIDER_SITE_OTHER): Payer: BC Managed Care – PPO | Admitting: Family Medicine

## 2022-09-09 VITALS — BP 122/74 | HR 75 | Temp 98.6°F | Ht 67.0 in | Wt 177.8 lb

## 2022-09-09 DIAGNOSIS — N8110 Cystocele, unspecified: Secondary | ICD-10-CM

## 2022-09-09 DIAGNOSIS — Z13220 Encounter for screening for lipoid disorders: Secondary | ICD-10-CM

## 2022-09-09 DIAGNOSIS — Z1329 Encounter for screening for other suspected endocrine disorder: Secondary | ICD-10-CM

## 2022-09-09 DIAGNOSIS — Z0001 Encounter for general adult medical examination with abnormal findings: Secondary | ICD-10-CM | POA: Diagnosis not present

## 2022-09-09 DIAGNOSIS — Z8249 Family history of ischemic heart disease and other diseases of the circulatory system: Secondary | ICD-10-CM

## 2022-09-09 DIAGNOSIS — Z130 Encounter for screening for diseases of the blood and blood-forming organs and certain disorders involving the immune mechanism: Secondary | ICD-10-CM

## 2022-09-09 DIAGNOSIS — E663 Overweight: Secondary | ICD-10-CM | POA: Diagnosis not present

## 2022-09-09 NOTE — Assessment & Plan Note (Signed)
I discussed with the patient that I think this would not be worth her time going to cardiology for her.  She is not currently having any symptoms and there is really not that much that they would do for her given lack of symptoms.  If she does develop symptoms she will let us know and we could consider evaluation and treatment and referral to cardiology at that time.

## 2022-09-09 NOTE — Progress Notes (Signed)
Tommi Rumps, MD Phone: [phone]  Carolyn Harris is a 47 y.o. female who presents today for CPE.  Diet: stable, eats out some, soda daily Exercise: walking more Pap smear: s/p hysterectomy Colonoscopy: 09/23/20 10 year recall Mammogram: 03/05/22 negative Family history-  Colon cancer: no  Breast cancer: no  Ovarian cancer: no Menses: s/p hysterectomy Vaccines-   Flu: UTD  Tetanus: UTD  COVID19: x3 HIV screening: UTd Hep C Screening: declines Tobacco use: no Alcohol use: no Illicit Drug use: no Dentist: yes Ophthalmology: yes  Bladder prolapse: Patient notes she underwent a partial fix for this in the past when she had her hysterectomy.  She has felt a prolapse recently though she notes is not at a point where she wants to do anything about this.  She does have some urine leakage from this.  Family history of atrial fibrillation: Patient notes her uncles are on her about getting evaluated for A-fib.  The patient notes no persistent issues with palpitations and rarely feels as though she has a flutter which previously were felt to be related to PVCs.  She does note some stress at work.   Active Ambulatory Problems    Diagnosis Date Noted   Encounter for general adult medical examination with abnormal findings 04/06/2016   Chronic cough 04/08/2017   Anxiety and depression 04/08/2017   Palpitations 04/11/2018   Bladder prolapse, female, acquired 04/14/2019   Lipid screening 07/30/2020   Overweight 07/30/2020   Memory difficulty 07/30/2020   Family history of atrial fibrillation 09/09/2022   Resolved Ambulatory Problems    Diagnosis Date Noted   Breast pain 08/12/2021   Past Medical History:  Diagnosis Date   Anxiety     Family History  Problem Relation Age of Onset   Heart disease Father    Other Mother        Pulmonary hemorrhage per patient report; Age 52.   Breast cancer Neg Hx     Social History   Socioeconomic History   Marital status: Married     Spouse name: Not on file   Number of children: Not on file   Years of education: Not on file   Highest education level: Not on file  Occupational History   Not on file  Tobacco Use   Smoking status: Never   Smokeless tobacco: Never  Vaping Use   Vaping Use: Never used  Substance and Sexual Activity   Alcohol use: Yes    Comment: occ   Drug use: No   Sexual activity: Yes    Partners: Male  Other Topics Concern   Not on file  Social History Narrative   Not on file   Social Determinants of Health   Financial Resource Strain: Not on file  Food Insecurity: Not on file  Transportation Needs: Not on file  Physical Activity: Not on file  Stress: Not on file  Social Connections: Not on file  Intimate Partner Violence: Not on file    ROS  General:  Negative for nexplained weight loss, fever Skin: Negative for new or changing mole, sore that won't heal HEENT: Negative for trouble hearing, trouble seeing, ringing in ears, mouth sores, hoarseness, change in voice, dysphagia. CV:  Negative for chest pain, dyspnea, edema, palpitations Resp: Negative for cough, dyspnea, hemoptysis GI: Negative for nausea, vomiting, diarrhea, constipation, abdominal pain, melena, hematochezia. GU: Negative for dysuria, incontinence, urinary hesitance, hematuria, vaginal or penile discharge, polyuria, sexual difficulty, lumps in testicle or breasts MSK: Negative for muscle cramps  or aches, joint pain or swelling Neuro: Negative for headaches, weakness, numbness, dizziness, passing out/fainting Psych: Negative for depression, anxiety, memory problems  Objective  Physical Exam Vitals:   09/09/22 1204  BP: 122/74  Pulse: 75  Temp: 98.6 F (37 C)  SpO2: 99%    BP Readings from Last 3 Encounters:  09/09/22 122/74  08/12/21 110/70  09/23/20 102/75   Wt Readings from Last 3 Encounters:  09/09/22 177 lb 12.8 oz (80.6 kg)  08/12/21 173 lb 12.8 oz (78.8 kg)  09/23/20 170 lb (77.1 kg)     Physical Exam Constitutional:      General: She is not in acute distress.    Appearance: She is not diaphoretic.  HENT:     Head: Normocephalic and atraumatic.  Cardiovascular:     Rate and Rhythm: Normal rate and regular rhythm.     Heart sounds: Normal heart sounds.  Pulmonary:     Effort: Pulmonary effort is normal.     Breath sounds: Normal breath sounds.  Abdominal:     General: Bowel sounds are normal. There is no distension.     Palpations: Abdomen is soft.     Tenderness: There is no abdominal tenderness.  Musculoskeletal:     Right lower leg: No edema.     Left lower leg: No edema.  Lymphadenopathy:     Cervical: No cervical adenopathy.  Skin:    General: Skin is warm and dry.  Neurological:     Mental Status: She is alert.      Assessment/Plan:   Encounter for general adult medical examination with abnormal findings Assessment & Plan: Physical exam completed.  I encouraged healthy diet and exercise.  Discussed reducing her soda intake.  Cancer screening is up-to-date.  Discussed she could get the updated COVID vaccination when the new one comes out in the fall.  Lab work as outlined.   Lipid screening -     Comprehensive metabolic panel; Future -     Lipid panel; Future  Overweight -     Hemoglobin A1c; Future  Bladder prolapse, female, acquired Assessment & Plan: Patient reported symptoms related to this.  She does not want to seek treatment for this as of yet.  When she is ready she will let us know we can refer to urology at [facility].   Thyroid disorder screen -     TSH; Future  Screening for deficiency anemia -     CBC; Future  Family history of atrial fibrillation Assessment & Plan: I discussed with the patient that I think this would not be worth her time going to cardiology for her.  She is not currently having any symptoms and there is really not that much that they would do for her given lack of symptoms.  If she does develop symptoms she  will let us know and we could consider evaluation and treatment and referral to cardiology at that time.     Return in about 1 week (around 09/16/2022) for labs, 1 year cpe.   Tommi Rumps, MD [place]

## 2022-09-09 NOTE — Assessment & Plan Note (Signed)
Patient reported symptoms related to this.  She does not want to seek treatment for this as of yet.  When she is ready she will let us know we can refer to urology at [facility].

## 2022-09-09 NOTE — Assessment & Plan Note (Signed)
Physical exam completed.  I encouraged healthy diet and exercise.  Discussed reducing her soda intake.  Cancer screening is up-to-date.  Discussed she could get the updated COVID vaccination when the new one comes out in the fall.  Lab work as outlined.

## 2022-09-10 ENCOUNTER — Other Ambulatory Visit (INDEPENDENT_AMBULATORY_CARE_PROVIDER_SITE_OTHER): Payer: BC Managed Care – PPO

## 2022-09-10 DIAGNOSIS — Z130 Encounter for screening for diseases of the blood and blood-forming organs and certain disorders involving the immune mechanism: Secondary | ICD-10-CM | POA: Diagnosis not present

## 2022-09-10 DIAGNOSIS — Z1329 Encounter for screening for other suspected endocrine disorder: Secondary | ICD-10-CM

## 2022-09-10 DIAGNOSIS — Z13220 Encounter for screening for lipoid disorders: Secondary | ICD-10-CM

## 2022-09-10 DIAGNOSIS — E663 Overweight: Secondary | ICD-10-CM | POA: Diagnosis not present

## 2022-09-10 LAB — HEMOGLOBIN A1C: Hgb A1c MFr Bld: 5.6 % (ref 4.6–6.5)

## 2022-09-10 LAB — LIPID PANEL
Cholesterol: 212 mg/dL — ABNORMAL HIGH (ref 0–200)
HDL: 38.7 mg/dL — ABNORMAL LOW (ref >39.00)
LDL Cholesterol: 148 mg/dL — ABNORMAL HIGH (ref 0–99)
NonHDL: 173.77
Total CHOL/HDL Ratio: 5
Triglycerides: 131 mg/dL (ref 0.0–149.0)
VLDL: 26.2 mg/dL (ref 0.0–40.0)

## 2022-09-10 LAB — COMPREHENSIVE METABOLIC PANEL
ALT: 12 U/L (ref 0–35)
AST: 16 U/L (ref 0–37)
Albumin: 4.4 g/dL (ref 3.5–5.2)
Alkaline Phosphatase: 67 U/L (ref 39–117)
BUN: 9 mg/dL (ref 6–23)
CO2: 29 mEq/L (ref 19–32)
Calcium: 9.1 mg/dL (ref 8.4–10.5)
Chloride: 103 mEq/L (ref 96–112)
Creatinine, Ser: 0.59 mg/dL (ref 0.40–1.20)
GFR: 106.96 mL/min (ref >60.00)
Glucose, Bld: 98 mg/dL (ref 70–99)
Potassium: 4.5 mEq/L (ref 3.5–5.1)
Sodium: 138 mEq/L (ref 135–145)
Total Bilirubin: 0.5 mg/dL (ref 0.2–1.2)
Total Protein: 7.2 g/dL (ref 6.0–8.3)

## 2022-09-10 LAB — CBC
HCT: 41.8 % (ref 36.0–46.0)
Hemoglobin: 14.2 g/dL (ref 12.0–15.0)
MCHC: 33.9 g/dL (ref 30.0–36.0)
MCV: 88.3 fl (ref 78.0–100.0)
Platelets: 323 10*3/uL (ref 150.0–400.0)
RBC: 4.74 Mil/uL (ref 3.87–5.11)
RDW: 12.6 % (ref 11.5–15.5)
WBC: 7.2 10*3/uL (ref 4.0–10.5)

## 2022-09-10 LAB — TSH: TSH: 2.03 u[IU]/mL (ref 0.35–5.50)

## 2022-11-16 ENCOUNTER — Other Ambulatory Visit: Payer: Self-pay | Admitting: Family Medicine

## 2022-11-16 DIAGNOSIS — F32A Depression, unspecified: Secondary | ICD-10-CM

## 2022-11-17 NOTE — Telephone Encounter (Signed)
LOV: 09/09/22  NOV: 09/15/23

## 2023-01-21 ENCOUNTER — Other Ambulatory Visit: Payer: Self-pay | Admitting: Family Medicine

## 2023-01-21 DIAGNOSIS — Z1231 Encounter for screening mammogram for malignant neoplasm of breast: Secondary | ICD-10-CM

## 2023-03-25 ENCOUNTER — Ambulatory Visit
Admission: RE | Admit: 2023-03-25 | Discharge: 2023-03-25 | Disposition: A | Discharge status: Home / Self Care | Payer: Managed Care, Other (non HMO) | Source: Ambulatory Visit | Attending: Family Medicine | Admitting: Family Medicine

## 2023-03-25 DIAGNOSIS — Z1231 Encounter for screening mammogram for malignant neoplasm of breast: Secondary | ICD-10-CM | POA: Diagnosis present

## 2023-04-05 ENCOUNTER — Other Ambulatory Visit: Payer: Self-pay | Admitting: Family Medicine

## 2023-04-05 DIAGNOSIS — F419 Anxiety disorder, unspecified: Secondary | ICD-10-CM

## 2023-04-06 MED ORDER — ALPRAZOLAM 0.25 MG PO TABS
ORAL_TABLET | ORAL | 0 refills | Status: DC
Start: 2023-04-06 — End: 2023-09-15

## 2023-09-15 ENCOUNTER — Encounter: Payer: Self-pay | Admitting: Nurse Practitioner

## 2023-09-15 ENCOUNTER — Ambulatory Visit: Payer: BC Managed Care – PPO | Admitting: Nurse Practitioner

## 2023-09-15 ENCOUNTER — Encounter: Payer: BC Managed Care – PPO | Admitting: Family Medicine

## 2023-09-15 VITALS — BP 118/72 | HR 65 | Temp 98.5°F | Ht 67.0 in | Wt 174.0 lb

## 2023-09-15 DIAGNOSIS — E538 Deficiency of other specified B group vitamins: Secondary | ICD-10-CM

## 2023-09-15 DIAGNOSIS — Z1231 Encounter for screening mammogram for malignant neoplasm of breast: Secondary | ICD-10-CM

## 2023-09-15 DIAGNOSIS — F419 Anxiety disorder, unspecified: Secondary | ICD-10-CM | POA: Diagnosis not present

## 2023-09-15 DIAGNOSIS — F32A Depression, unspecified: Secondary | ICD-10-CM

## 2023-09-15 DIAGNOSIS — Z0000 Encounter for general adult medical examination without abnormal findings: Secondary | ICD-10-CM | POA: Diagnosis not present

## 2023-09-15 DIAGNOSIS — Z1329 Encounter for screening for other suspected endocrine disorder: Secondary | ICD-10-CM | POA: Diagnosis not present

## 2023-09-15 DIAGNOSIS — E663 Overweight: Secondary | ICD-10-CM | POA: Diagnosis not present

## 2023-09-15 DIAGNOSIS — E785 Hyperlipidemia, unspecified: Secondary | ICD-10-CM

## 2023-09-15 LAB — CBC WITH DIFFERENTIAL/PLATELET
Basophils Absolute: 0 10*3/uL (ref 0.0–0.1)
Basophils Relative: 0.7 % (ref 0.0–3.0)
Eosinophils Absolute: 0.1 10*3/uL (ref 0.0–0.7)
Eosinophils Relative: 1.9 % (ref 0.0–5.0)
HCT: 40.6 % (ref 36.0–46.0)
Hemoglobin: 13.6 g/dL (ref 12.0–15.0)
Lymphocytes Relative: 29.8 % (ref 12.0–46.0)
Lymphs Abs: 1.7 10*3/uL (ref 0.7–4.0)
MCHC: 33.5 g/dL (ref 30.0–36.0)
MCV: 89.2 fl (ref 78.0–100.0)
Monocytes Absolute: 0.4 10*3/uL (ref 0.1–1.0)
Monocytes Relative: 7.9 % (ref 3.0–12.0)
Neutro Abs: 3.4 10*3/uL (ref 1.4–7.7)
Neutrophils Relative %: 59.7 % (ref 43.0–77.0)
Platelets: 316 10*3/uL (ref 150.0–400.0)
RBC: 4.55 Mil/uL (ref 3.87–5.11)
RDW: 12.2 % (ref 11.5–15.5)
WBC: 5.6 10*3/uL (ref 4.0–10.5)

## 2023-09-15 LAB — LIPID PANEL
Cholesterol: 201 mg/dL — ABNORMAL HIGH (ref 0–200)
HDL: 37.2 mg/dL — ABNORMAL LOW (ref >39.00)
LDL Cholesterol: 129 mg/dL — ABNORMAL HIGH (ref 0–99)
NonHDL: 163.6
Total CHOL/HDL Ratio: 5
Triglycerides: 174 mg/dL — ABNORMAL HIGH (ref 0.0–149.0)
VLDL: 34.8 mg/dL (ref 0.0–40.0)

## 2023-09-15 LAB — COMPREHENSIVE METABOLIC PANEL WITH GFR
ALT: 13 U/L (ref 0–35)
AST: 16 U/L (ref 0–37)
Albumin: 4.4 g/dL (ref 3.5–5.2)
Alkaline Phosphatase: 57 U/L (ref 39–117)
BUN: 11 mg/dL (ref 6–23)
CO2: 26 meq/L (ref 19–32)
Calcium: 9 mg/dL (ref 8.4–10.5)
Chloride: 105 meq/L (ref 96–112)
Creatinine, Ser: 0.45 mg/dL (ref 0.40–1.20)
GFR: 113.37 mL/min (ref >60.00)
Glucose, Bld: 110 mg/dL — ABNORMAL HIGH (ref 70–99)
Potassium: 3.8 meq/L (ref 3.5–5.1)
Sodium: 137 meq/L (ref 135–145)
Total Bilirubin: 0.4 mg/dL (ref 0.2–1.2)
Total Protein: 6.9 g/dL (ref 6.0–8.3)

## 2023-09-15 LAB — HEMOGLOBIN A1C: Hgb A1c MFr Bld: 5.6 % (ref 4.6–6.5)

## 2023-09-15 MED ORDER — ALPRAZOLAM 0.25 MG PO TABS: ORAL_TABLET | ORAL | 1 refills | Status: AC

## 2023-09-15 NOTE — Assessment & Plan Note (Signed)
 She previously received monthly B12 injections but discontinued them as her last lab results were normal. We will check B12 level and restart injections if necessary.

## 2023-09-15 NOTE — Progress Notes (Signed)
 Carolyn Dicker, NP-C Phone: [phone]  Carolyn Harris is a 48 y.o. female who presents today for transfer of care and annual exam.   Discussed the use of AI scribe software for clinical note transcription with the patient, who gave verbal consent to proceed.  History of Present Illness   Carolyn Harris is a 48 year old female who presents for a transfer of care and annual exam.  She uses Xanax as needed, typically receiving it during her annual physicals without refills in between. She has about ten pills left and uses them sparingly, indicating that she does not require daily medication for anxiety. She has a history of using Lexapro in the past but did not find it helpful. 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  She previously received B12 injections monthly after building up her levels, but her recent labs were normal, so she discontinued them. She maintains a diet with a high intake of soda and caffeine, acknowledging that it contributes to weight gain. Her diet is 'fifty-fifty' between home-cooked meals and eating out, with no specific dietary restrictions or regimens.  There is a family history of atrial fibrillation, with both uncles having undergone Watchman procedures and ablations. Her mother experienced a pulmonary hemorrhage at age 26, and her father was labeled as having a heart attack at age 1, though no autopsy was performed. She is aware of her high cholesterol levels but is not currently on medication for it.  No chest pain, shortness of breath, abdominal pain, constipation, diarrhea, or burning during urination. Regular bowel movements every other day and no issues with urination. Experiences dryness of the skin but no other skin changes or rashes. Sleeps well with the aid of melatonin, which helps her unwind. She had a hysterectomy and is not due for a mammogram until October. Colonoscopy due in 2032. She does not smoke,  drink alcohol or use illicit drugs.      Social History   Tobacco Use  Smoking Status Never  Smokeless Tobacco Never    Current Outpatient Medications on File Prior to Visit  Medication Sig Dispense Refill   aspirin EC 81 MG tablet Take 81 mg by mouth daily.     meloxicam (MOBIC) 15 MG tablet Take 15 mg by mouth daily.     Multiple Vitamins-Minerals (MULTIVITAMIN ADULT PO) Take by mouth.     No current facility-administered medications on file prior to visit.     ROS see history of present illness  Objective  Physical Exam Vitals:   09/15/23 0912  BP: 118/72  Pulse: 65  Temp: 98.5 F (36.9 C)  SpO2: 97%    BP Readings from Last 3 Encounters:  09/15/23 118/72  09/09/22 122/74  08/12/21 110/70   Wt Readings from Last 3 Encounters:  09/15/23 174 lb (78.9 kg)  09/09/22 177 lb 12.8 oz (80.6 kg)  08/12/21 173 lb 12.8 oz (78.8 kg)    Physical Exam Constitutional:      General: She is not in acute distress.    Appearance: Normal appearance.  HENT:     Head: Normocephalic.     Right Ear: Tympanic membrane normal.     Left Ear: Tympanic membrane normal.     Nose: Nose normal.     Mouth/Throat:     Mouth: Mucous membranes are moist.     Pharynx: Oropharynx is clear.  Eyes:     Conjunctiva/sclera: Conjunctivae normal.  Pupils: Pupils are equal, round, and reactive to light.  Neck:     Thyroid: No thyromegaly.  Cardiovascular:     Rate and Rhythm: Normal rate and regular rhythm.     Heart sounds: Normal heart sounds.  Pulmonary:     Effort: Pulmonary effort is normal.     Breath sounds: Normal breath sounds.  Abdominal:     General: Abdomen is flat. Bowel sounds are normal.     Palpations: Abdomen is soft. There is no mass.     Tenderness: There is no abdominal tenderness.  Musculoskeletal:        General: Normal range of motion.  Lymphadenopathy:     Cervical: No cervical adenopathy.  Skin:    General: Skin is warm and dry.     Findings: No rash.   Neurological:     General: No focal deficit present.     Mental Status: She is alert.  Psychiatric:        Mood and Affect: Mood normal.        Behavior: Behavior normal.     Assessment/Plan: Please see individual problem list.  Preventative health care Assessment & Plan: Physical exam complete. We will order routine lab work as outlined and contact patient with results. Pap smears no longer indicated due to hysterectomy, mammogram due in October, and colonoscopy is up to date. She acknowledges excessive soda consumption and exercises more in summer. Flu and tetanus vaccines are up to date. She has received 3 COVID vaccines and declines additional. Order mammogram for October 2025. Encourage reduction in soda consumption and regular exercise. Continue routine dental and eye exams. Return to care in one year, sooner as needed.   Orders: -     CBC with Differential/Platelet  Anxiety and depression Assessment & Plan: She uses Xanax infrequently, usually getting a refill once a year. Due to a family history of addiction, she is cautious with medication. Previous treatments like Lexapro were ineffective. Monitoring Xanax use and considering alternatives if anxiety increases was discussed. PHQ- 5 and GAD- 9 today. Continue Xanax 0.25 mg as needed. Schedule follow-up if Xanax use increases or anxiety worsens. PDMP reviewed. Non-Opioid Controlled Substance Agreement signed today.   Orders: -     ALPRAZolam; TAKE 1 TABLET BY MOUTH DAILY AS NEEDED FOR ANXIETY  Dispense: 30 tablet; Refill: 1 -     VITAMIN D 25 Hydroxy (Vit-D Deficiency, Fractures)  Hyperlipidemia, unspecified hyperlipidemia type Assessment & Plan: She has elevated cholesterol and a family history of cardiovascular issues. She is hesitant about statins due to potential side effects. We will check fasting lipid panel to assess current status. Encourage healthy diet and regular exercise.   Orders: -     Comprehensive metabolic  panel -     Lipid panel  B12 deficiency Assessment & Plan: She previously received monthly B12 injections but discontinued them as her last lab results were normal. We will check B12 level and restart injections if necessary.   Orders: -     Vitamin B12  Overweight -     Hemoglobin A1c  Thyroid disorder screen -     TSH  Screening mammogram for breast cancer -     3D Screening Mammogram, Left and Right; Future    Return in about 1 year (around 09/14/2024) for Annual Exam, sooner as needed.   Carolyn Dicker, NP-C [facility]

## 2023-09-15 NOTE — Assessment & Plan Note (Addendum)
 She uses Xanax infrequently, usually getting a refill once a year. Due to a family history of addiction, she is cautious with medication. Previous treatments like Lexapro were ineffective. Monitoring Xanax use and considering alternatives if anxiety increases was discussed. PHQ- 5 and GAD- 9 today. Continue Xanax 0.25 mg as needed. Schedule follow-up if Xanax use increases or anxiety worsens. PDMP reviewed. Non-Opioid Controlled Substance Agreement signed today.

## 2023-09-15 NOTE — Assessment & Plan Note (Addendum)
 She has elevated cholesterol and a family history of cardiovascular issues. She is hesitant about statins due to potential side effects. We will check fasting lipid panel to assess current status. Encourage healthy diet and regular exercise.

## 2023-09-15 NOTE — Assessment & Plan Note (Signed)
 Physical exam complete. We will order routine lab work as outlined and contact patient with results. Pap smears no longer indicated due to hysterectomy, mammogram due in October, and colonoscopy is up to date. She acknowledges excessive soda consumption and exercises more in summer. Flu and tetanus vaccines are up to date. She has received 3 COVID vaccines and declines additional. Order mammogram for October 2025. Encourage reduction in soda consumption and regular exercise. Continue routine dental and eye exams. Return to care in one year, sooner as needed.

## 2023-09-15 NOTE — Patient Instructions (Signed)
YOUR MAMMOGRAM IS DUE in October, PLEASE CALL AND GET THIS SCHEDULED! [facility] - call [phone]

## 2023-09-16 LAB — VITAMIN B12: Vitamin B-12: 321 pg/mL (ref 211–911)

## 2023-09-16 LAB — VITAMIN D 25 HYDROXY (VIT D DEFICIENCY, FRACTURES): VITD: 34.86 ng/mL (ref 30.00–100.00)

## 2023-09-16 LAB — TSH: TSH: 1.58 u[IU]/mL (ref 0.35–5.50)

## 2023-09-17 ENCOUNTER — Encounter: Payer: Self-pay | Admitting: Nurse Practitioner

## 2023-12-09 DIAGNOSIS — H524 Presbyopia: Secondary | ICD-10-CM | POA: Diagnosis not present

## 2023-12-09 DIAGNOSIS — H52223 Regular astigmatism, bilateral: Secondary | ICD-10-CM | POA: Diagnosis not present

## 2024-03-30 ENCOUNTER — Ambulatory Visit
Admission: RE | Admit: 2024-03-30 | Discharge: 2024-03-30 | Disposition: A | Discharge status: Home / Self Care | Source: Ambulatory Visit | Attending: Nurse Practitioner | Admitting: Nurse Practitioner

## 2024-03-30 DIAGNOSIS — Z1231 Encounter for screening mammogram for malignant neoplasm of breast: Secondary | ICD-10-CM | POA: Diagnosis not present

## 2024-03-31 ENCOUNTER — Ambulatory Visit: Payer: Self-pay | Admitting: Nurse Practitioner

## 2024-04-26 DIAGNOSIS — Z23 Encounter for immunization: Secondary | ICD-10-CM | POA: Diagnosis not present

## 2024-09-20 ENCOUNTER — Encounter: Admitting: Nurse Practitioner
